# Patient Record
Sex: Female | Born: 1950 | Race: White | Hispanic: No
Health system: Midwestern US, Academic
[De-identification: ages and names within clinical notes are randomized; demographics above are authoritative.]

---

## 2016-09-06 MED ORDER — HEPARIN, PORCINE (PF) 100 UNIT/ML IV SYRG
500 [IU] | Freq: Once | 0 refills | Status: CP
Start: 2016-09-06 — End: ?

## 2016-09-06 MED ORDER — ESTRADIOL 2 MG (7.5 MCG /24 HOUR) VA RING
1 | VAGINAL | 3 refills | 43.00000 days | Status: DC
Start: 2016-09-06 — End: 2017-03-14

## 2017-03-14 ENCOUNTER — Encounter: Admit: 2017-03-14 | Discharge: 2017-03-14 | Payer: PRIVATE HEALTH INSURANCE

## 2017-03-14 ENCOUNTER — Encounter: Admit: 2017-03-14 | Discharge: 2017-03-14 | Payer: MEDICARE

## 2017-03-14 ENCOUNTER — Encounter: Admit: 2017-03-14 | Discharge: 2017-03-15 | Payer: MEDICARE

## 2017-03-14 DIAGNOSIS — Z170 Estrogen receptor positive status [ER+]: ICD-10-CM

## 2017-03-14 DIAGNOSIS — E669 Obesity, unspecified: ICD-10-CM

## 2017-03-14 DIAGNOSIS — Z86711 Personal history of pulmonary embolism: ICD-10-CM

## 2017-03-14 DIAGNOSIS — I10 Essential (primary) hypertension: ICD-10-CM

## 2017-03-14 DIAGNOSIS — C50919 Malignant neoplasm of unspecified site of unspecified female breast: ICD-10-CM

## 2017-03-14 DIAGNOSIS — Z9011 Acquired absence of right breast and nipple: ICD-10-CM

## 2017-03-14 DIAGNOSIS — C50811 Malignant neoplasm of overlapping sites of right female breast: Principal | ICD-10-CM

## 2017-03-14 DIAGNOSIS — Z79811 Long term (current) use of aromatase inhibitors: ICD-10-CM

## 2017-03-14 DIAGNOSIS — I82409 Acute embolism and thrombosis of unspecified deep veins of unspecified lower extremity: ICD-10-CM

## 2017-03-14 DIAGNOSIS — Z86718 Personal history of other venous thrombosis and embolism: ICD-10-CM

## 2017-03-14 DIAGNOSIS — I351 Nonrheumatic aortic (valve) insufficiency: ICD-10-CM

## 2017-03-14 DIAGNOSIS — C7951 Secondary malignant neoplasm of bone: ICD-10-CM

## 2017-03-14 DIAGNOSIS — T148XXA Other injury of unspecified body region, initial encounter: ICD-10-CM

## 2017-03-14 DIAGNOSIS — Z7901 Long term (current) use of anticoagulants: ICD-10-CM

## 2017-03-14 DIAGNOSIS — C50911 Malignant neoplasm of unspecified site of right female breast: ICD-10-CM

## 2017-03-14 MED ORDER — HEPARIN, PORCINE (PF) 100 UNIT/ML IV SYRG
500 [IU] | Freq: Once | 0 refills | Status: CP
Start: 2017-03-14 — End: ?

## 2017-03-14 NOTE — Progress Notes
Date of Service: 03/14/2017      Subjective:             Reason for Visit:    Sydney Mcconnell is a 66 y.o. female.    Cancer Staging  Malignant neoplasm of overlapping sites of right female breast [facility])  Staging form: Breast, AJCC 6th Edition  - Clinical: No stage assigned - Unsigned  - Pathologic: Stage IIB (T2, N1, MX) - Unsigned      History of Present Illness    CANCER HISTORY: The patient was originally diagnosed with right breast cancer in 1995, T2N1M0, 3/32 positive lymph nodes. Tumor was ER/PR positive, HER-2 Positive. She received chemotherapy with Adriamycin and Cytoxan for four cycles and then received five years of tamoxifen.    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  She had an intramedullary rod placed in the right femur and had radiation. She underwent 6 cycles of Taxotere and Herceptin, and was placed on Aredia and Arimidex. The Aredia was eventually switched to Zometa.     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 She underwent debridement, 20 hyperbaric oxygen therapies, and was dramatically improved. The process essentially arrested in 2008.     In 2010 she had the right femur rod replaced as it was bending. Intramedullary rod replaced again in Jan 2011 when she tripped and was found to have a linear nondisplaced fracture of the lateral cortex of the proximal R diaphysis corresponding to the focus of increased enhancement on the bone scan.    March 30, 2012 she had a fall and broke left femur. A rod was placed there. She had a long period of PT.     She had done well until May 2014 when she felt she pulled a muscle or broke a bone in her rib or scapula and had been in extreme pain not relieved by NSAIDS. Bone scan 09/26/12 showed an area of increased uptake in left posterior rib in area of discomfort. Compatible with small rib fracture (patient carrying a box when it occurred ). Plain rib films negative for fracture or mass. This pain was in the right back, under the scapula. Tumor markers did not rise.    Due to delayed union of a left subtrochanteric femur fracture, she met back with Dr Cherene Julian in Orthopedic surgery. She had dynamization (removal) of the left trochanteric femoral nail on 01/01/13.     She had follow-up with Dr Cherene Julian May 2015. X-rays show significant healing since 12/2012 x-rays.     Ongoing back pain after a snap sound in early July 2104:    CT chest done 11/02/12:  There are no pathologically enlarged mediastinal or hilar lymph nodes, though limited evaluation in the absence of IV contrast.   There is no new or enlarging pulmonary nodule.  There are no aggressive osseous lesions or displaced rib fracture. An old right posterior seventh rib fracture is seen. There is exaggeration of the normal thoracic kyphosis. Prior right mastectomy and flap reconstruction is again noted without axillary lymphadenopathy.      Mammogram 03/27/14: Unremarkable.     Mammogram 03/29/15:There are scattered areas of fibroglandular density. ???No masses, densities or calcifications to suggest malignancy. No change when compared to prior studies.    Mammogram Dec 2017:  There are scattered areas of fibroglandular density. ???No masses, densities or calcifications to suggest malignancy. No change  when  compared to prior studies.          Past Medical History: Episodic tachycardia; follows with cardiology. 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     Family History: Mother died with CHF age 52. 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         INTERVAL HISTORY:    She is here for her 6 month follow up.  She has a metastatic breast cancer history, but no recent bone progression. Continues on Arimidex, without much change.     Had an ER visit in July with an episode of right jaw numbness lasting 6 hours - CT head was ok.     Still having lymphedema of right arm, chronic, unchanged. Using compression sleeves and more helpful.     No pain meds needed. Back and leg much improved.     Vision is poor at baseline. She will get some vertigo.     Has a port in left arm. Functioning well when she gets blood draws.     History of DVT. No new leg pains. On chronic coumadin.  Taking 2 mg,  6 days a week.   INR not checked in a while.     Always sleeps sitting up - this is a chronic issue for years. Rare edema of legs.   Bps have been controlled.        Ref. Range 10/09/2015 14:31 03/26/2016 11:18 03/27/2016 06:00 04/03/2016 10:53 09/06/2016 10:53   CA27.29 Latest Units: U/mL 21  24  27.4     Port flushed today.        Review of Systems    No headaches or vision changes  No swallowing problems  No new lymph nodes enlarged  No bruising or petechiae  No breathing difficulty  The patient denies chest pain or pleurisy  No abdominal pain  No change in bowel habits or gastrointestinal bleeding  No hematuria  There has been no change in arthralgias  No new skin rashes  No leg edema noted      Objective:         ??? amLODIPine (NORVASC) 10 mg tablet Take 1 Tab by mouth daily.   ??? anastrozole (ARIMIDEX) 1 mg tablet Take 1 tablet by mouth daily.   ??? Calcium Carbonate 600 mg (1,500 mg) tab Take 2 Tabs by mouth daily.   ??? CARVEDILOL (COREG PO) Take  by mouth. Indications: 1 po BID   ??? cholecalciferol (VITAMIN D-3) 400 unit tab Take 400 Units by mouth twice daily.   ??? CYCLOBENZAPRINE HCL (FLEXERIL PO) Take 0.5 mg by mouth as Needed.   ??? estradiol (ESTRING) 2 mg (7.5 mcg /24 hour) vaginal ring Insert or Apply 1 each to vaginal area as directed. follow package directions   ??? ibuprofen (MOTRIN) 200 mg tablet Take 200 mg by mouth every 6 hours as needed for Pain. Indications: 4 po QD ??? lisinopril (PRINIVIL, ZESTRIL) 40 mg tablet Take 1 Tab by mouth twice daily.   ??? LORATADINE/PSEUDOEPHEDRINE (CLARITIN-D 12 HOUR PO) Take  by mouth.   ??? warfarin (COUMADIN) 2 mg tablet Take 1 tablet by mouth daily. 2 mg/day for six days 0 mg on seventh day  Indications: 6 day, 0 po on 7th day       There is no height  or weight on file to calculate BMI.               Pain Addressed:  N/A    Patient Evaluated for a Clinical Trial: Patient not eligible for a treatment trial (including not needing treatment, needs palliative care, in remission).     [place] Cooperative Oncology Group performance status is 1, Restricted in physically strenuous activity but ambulatory and able to carry out work of a light or sedentary nature, e.g., light house work, office work.     Physical Exam       In general, the patient is alert and pleasant  No change in vertigo with head position  No scleral icterus, no injection of the sclera  No sinus tenderness  No oral redness  No neck lymphadenopathy or supraclavicular lymphadenopathy  No oral thrush or buccal petechiae  Lungs are clear without wheezing or dullness  Heart is regular, with faint systolic murmur  Right mastectomy  Right arm with chronic lympedema  Abdomen is soft, non-tender, with normal bowel sounds, and no pain or mass  Extremities show no edema or tenderness  No rash or petechiae  Normal gait, normal cranial nerves         Assessment and Plan:    1. History of PE and DVT: Cont coumadin at 2 mg; 6 days a week.   Last INR tests reviewed    2. Metastatic breast cancer history: The patient was originally diagnosed with right breast cancer in 1995, T2N1M0, 3/32 positive lymph nodes. Tumor was ER/PR positive, HER-2 Positive. She received chemotherapy with Adriamycin and Cytoxan for four cycles and then received five years of tamoxifen.    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  She had an intramedullary rod placed in the right femur and had radiation. She underwent 6 cycles of Taxotere and Herceptin, and was placed on Aredia and Arimidex.     Remains on Anastrazole (Arimidex).  She has had no clinical recurrence, and has a normal tumor marker recently, with a redraw today.       3. Slight stable murmur; no signs of CHF    4. Elevated BP is controlled    5. Seasonal allergies; using claritin D prn. No signs of bacterial URI.     RTC in 6 months.

## 2017-03-14 NOTE — Progress Notes
Pt here for nd/port flush/follow up, PAC accessed, flushed with + blood return, labs drawn, PAC flushed and de accessed, to see provider.

## 2017-03-15 LAB — CA-27.29: Lab: 24 U/mL (ref <38)

## 2017-03-15 LAB — COMPREHENSIVE METABOLIC PANEL: Lab: 104 mg/dL — ABNORMAL HIGH (ref >=60)

## 2017-03-24 ENCOUNTER — Encounter: Admit: 2017-03-24 | Discharge: 2017-03-24 | Payer: MEDICARE

## 2017-03-24 DIAGNOSIS — I359 Nonrheumatic aortic valve disorder, unspecified: Principal | ICD-10-CM

## 2017-03-24 MED ORDER — WARFARIN 2 MG PO TAB
2 mg | ORAL_TABLET | Freq: Every day | ORAL | 3 refills | 90.00000 days | Status: AC
Start: 2017-03-24 — End: 2018-03-30

## 2017-06-17 ENCOUNTER — Encounter: Admit: 2017-06-17 | Discharge: 2017-06-17 | Payer: MEDICARE

## 2017-06-17 MED ORDER — ANASTROZOLE 1 MG PO TAB
1 mg | ORAL_TABLET | Freq: Every day | ORAL | 3 refills | 33.00000 days | Status: AC
Start: 2017-06-17 — End: 2018-03-30

## 2017-07-05 ENCOUNTER — Encounter: Admit: 2017-07-05 | Discharge: 2017-07-05 | Payer: PRIVATE HEALTH INSURANCE

## 2017-07-05 DIAGNOSIS — C50919 Malignant neoplasm of unspecified site of unspecified female breast: Principal | ICD-10-CM

## 2017-07-05 DIAGNOSIS — C7951 Secondary malignant neoplasm of bone: ICD-10-CM

## 2017-07-05 DIAGNOSIS — Z170 Estrogen receptor positive status [ER+]: ICD-10-CM

## 2017-07-05 MED ORDER — HEPARIN, PORCINE (PF) 100 UNIT/ML IV SYRG
500 [IU] | Freq: Once | 0 refills | Status: CP
Start: 2017-07-05 — End: ?

## 2017-08-06 ENCOUNTER — Encounter: Admit: 2017-08-06 | Discharge: 2017-08-07 | Payer: MEDICARE

## 2017-08-07 ENCOUNTER — Encounter: Admit: 2017-08-07 | Discharge: 2017-08-07 | Payer: MEDICARE

## 2017-09-02 ENCOUNTER — Encounter: Admit: 2017-09-02 | Discharge: 2017-09-02 | Payer: MEDICARE

## 2017-09-02 DIAGNOSIS — Z1231 Encounter for screening mammogram for malignant neoplasm of breast: Principal | ICD-10-CM

## 2017-09-03 ENCOUNTER — Encounter: Admit: 2017-09-03 | Discharge: 2017-09-03 | Payer: MEDICARE

## 2017-09-03 ENCOUNTER — Encounter: Admit: 2017-09-03 | Discharge: 2017-09-03 | Payer: PRIVATE HEALTH INSURANCE

## 2017-09-03 DIAGNOSIS — Z1231 Encounter for screening mammogram for malignant neoplasm of breast: Principal | ICD-10-CM

## 2017-09-26 ENCOUNTER — Encounter: Admit: 2017-09-26 | Discharge: 2017-09-26 | Payer: MEDICARE

## 2017-09-26 ENCOUNTER — Encounter: Admit: 2017-09-26 | Discharge: 2017-09-26 | Payer: PRIVATE HEALTH INSURANCE

## 2017-09-26 DIAGNOSIS — Z7901 Long term (current) use of anticoagulants: ICD-10-CM

## 2017-09-26 DIAGNOSIS — E669 Obesity, unspecified: ICD-10-CM

## 2017-09-26 DIAGNOSIS — I82409 Acute embolism and thrombosis of unspecified deep veins of unspecified lower extremity: ICD-10-CM

## 2017-09-26 DIAGNOSIS — I10 Essential (primary) hypertension: ICD-10-CM

## 2017-09-26 DIAGNOSIS — Z170 Estrogen receptor positive status [ER+]: ICD-10-CM

## 2017-09-26 DIAGNOSIS — Z1231 Encounter for screening mammogram for malignant neoplasm of breast: ICD-10-CM

## 2017-09-26 DIAGNOSIS — I351 Nonrheumatic aortic (valve) insufficiency: Secondary | ICD-10-CM

## 2017-09-26 DIAGNOSIS — D6859 Other primary thrombophilia: ICD-10-CM

## 2017-09-26 DIAGNOSIS — I9589 Other hypotension: ICD-10-CM

## 2017-09-26 DIAGNOSIS — C7951 Secondary malignant neoplasm of bone: ICD-10-CM

## 2017-09-26 DIAGNOSIS — Z79811 Long term (current) use of aromatase inhibitors: ICD-10-CM

## 2017-09-26 DIAGNOSIS — Z86718 Personal history of other venous thrombosis and embolism: ICD-10-CM

## 2017-09-26 DIAGNOSIS — C50811 Malignant neoplasm of overlapping sites of right female breast: Principal | ICD-10-CM

## 2017-09-26 DIAGNOSIS — C50919 Malignant neoplasm of unspecified site of unspecified female breast: Principal | ICD-10-CM

## 2017-09-26 DIAGNOSIS — T148XXA Other injury of unspecified body region, initial encounter: ICD-10-CM

## 2017-09-26 LAB — CBC AND DIFF
Lab: 0.1 10*3/uL (ref >60)
Lab: 0.2 10*3/uL (ref >60)
Lab: 0.5 10*3/uL (ref 0–0.80)
Lab: 1 % (ref 0–2)
Lab: 1 10*3/uL (ref 1.0–4.8)
Lab: 10 % (ref 4–12)
Lab: 12 g/dL (ref 12.0–15.0)
Lab: 13 % (ref >60)
Lab: 18 % — ABNORMAL LOW (ref 24–44)
Lab: 231 K/UL (ref >60)
Lab: 3 % (ref 0–5)
Lab: 3.8 10*3/uL (ref 1.8–7.0)
Lab: 31 pg (ref 26–34)
Lab: 5.6 K/UL (ref 4.5–11.0)
Lab: 68 % (ref 41–77)
Lab: 8.4 FL (ref >60)

## 2017-09-26 LAB — COMPREHENSIVE METABOLIC PANEL
Lab: 10 U/L (ref 7–56)
Lab: 137 MMOL/L (ref 137–147)
Lab: 4.2 MMOL/L (ref 3.5–5.1)
Lab: >60 mL/min — ABNORMAL HIGH (ref >60)

## 2017-09-26 LAB — PROTIME INR (PT): Lab: 2.9 — ABNORMAL HIGH (ref 0.8–1.2)

## 2017-09-26 MED ORDER — HEPARIN, PORCINE (PF) 100 UNIT/ML IV SYRG
500 [IU] | Freq: Once | 0 refills | Status: CP
Start: 2017-09-26 — End: ?

## 2017-09-27 LAB — CA-27.29: Lab: 24 U/mL

## 2018-03-30 ENCOUNTER — Encounter: Admit: 2018-03-30 | Discharge: 2018-03-30 | Payer: MEDICARE

## 2018-03-30 ENCOUNTER — Encounter: Admit: 2018-03-30 | Discharge: 2018-03-30 | Payer: PRIVATE HEALTH INSURANCE

## 2018-03-30 DIAGNOSIS — C773 Secondary and unspecified malignant neoplasm of axilla and upper limb lymph nodes: ICD-10-CM

## 2018-03-30 DIAGNOSIS — Z9011 Acquired absence of right breast and nipple: ICD-10-CM

## 2018-03-30 DIAGNOSIS — C50919 Malignant neoplasm of unspecified site of unspecified female breast: Principal | ICD-10-CM

## 2018-03-30 DIAGNOSIS — C7951 Secondary malignant neoplasm of bone: ICD-10-CM

## 2018-03-30 DIAGNOSIS — Z7901 Long term (current) use of anticoagulants: ICD-10-CM

## 2018-03-30 DIAGNOSIS — D6859 Other primary thrombophilia: Secondary | ICD-10-CM

## 2018-03-30 DIAGNOSIS — Z9882 Breast implant status: ICD-10-CM

## 2018-03-30 DIAGNOSIS — Z5181 Encounter for therapeutic drug level monitoring: Secondary | ICD-10-CM

## 2018-03-30 DIAGNOSIS — I359 Nonrheumatic aortic valve disorder, unspecified: Secondary | ICD-10-CM

## 2018-03-30 DIAGNOSIS — Z79811 Long term (current) use of aromatase inhibitors: ICD-10-CM

## 2018-03-30 DIAGNOSIS — E669 Obesity, unspecified: ICD-10-CM

## 2018-03-30 DIAGNOSIS — C50811 Malignant neoplasm of overlapping sites of right female breast: Principal | ICD-10-CM

## 2018-03-30 DIAGNOSIS — Z86718 Personal history of other venous thrombosis and embolism: ICD-10-CM

## 2018-03-30 DIAGNOSIS — Z170 Estrogen receptor positive status [ER+]: ICD-10-CM

## 2018-03-30 DIAGNOSIS — I10 Essential (primary) hypertension: ICD-10-CM

## 2018-03-30 DIAGNOSIS — I82409 Acute embolism and thrombosis of unspecified deep veins of unspecified lower extremity: ICD-10-CM

## 2018-03-30 DIAGNOSIS — T148XXA Other injury of unspecified body region, initial encounter: ICD-10-CM

## 2018-03-30 DIAGNOSIS — I351 Nonrheumatic aortic (valve) insufficiency: ICD-10-CM

## 2018-03-30 LAB — CBC AND DIFF
Lab: 0 10*3/uL (ref 0–0.20)
Lab: 0.1 10*3/uL (ref 0–0.45)
Lab: 0.5 10*3/uL (ref 0–0.80)
Lab: 1 % (ref >60)
Lab: 1.1 10*3/uL (ref 1.0–4.8)
Lab: 12 g/dL (ref 12.0–15.0)
Lab: 13 % (ref 11–15)
Lab: 2 % (ref >60)
Lab: 205 K/UL (ref 150–400)
Lab: 31 pg (ref 26–34)
Lab: 33 g/dL (ref 32.0–36.0)
Lab: 37 % (ref 36–45)
Lab: 4 M/UL (ref 4.0–5.0)
Lab: 4.1 10*3/uL (ref 1.8–7.0)
Lab: 5.9 K/UL (ref 4.5–11.0)
Lab: 69 % (ref 41–77)
Lab: 7.6 FL (ref 7–11)
Lab: 93 FL (ref 80–100)

## 2018-03-30 LAB — COMPREHENSIVE METABOLIC PANEL
Lab: 143 MMOL/L (ref 137–147)
Lab: 3.6 MMOL/L (ref 3.5–5.1)

## 2018-03-30 LAB — PROTIME INR (PT): Lab: 4.4 — ABNORMAL HIGH (ref 0.8–1.2)

## 2018-03-30 MED ORDER — HEPARIN, PORCINE (PF) 100 UNIT/ML IV SYRG
500 [IU] | Freq: Once | 0 refills | Status: CP
Start: 2018-03-30 — End: ?

## 2018-03-30 MED ORDER — ANASTROZOLE 1 MG PO TAB
1 mg | ORAL_TABLET | Freq: Every day | ORAL | 3 refills | 33.00000 days | Status: AC
Start: 2018-03-30 — End: 2019-05-16

## 2018-03-30 MED ORDER — WARFARIN 2 MG PO TAB
2 mg | ORAL_TABLET | Freq: Every day | ORAL | 3 refills | 90.00000 days | Status: AC
Start: 2018-03-30 — End: 2019-04-02

## 2018-03-31 ENCOUNTER — Encounter: Admit: 2018-03-31 | Discharge: 2018-03-31 | Payer: MEDICARE

## 2018-03-31 DIAGNOSIS — D6859 Other primary thrombophilia: Principal | ICD-10-CM

## 2018-03-31 LAB — CA-27.29: Lab: 19 U/mL

## 2018-04-12 ENCOUNTER — Encounter: Admit: 2018-04-12 | Discharge: 2018-04-12 | Payer: MEDICARE

## 2018-04-12 ENCOUNTER — Encounter: Admit: 2018-04-12 | Discharge: 2018-04-12 | Payer: PRIVATE HEALTH INSURANCE

## 2018-04-12 DIAGNOSIS — D6859 Other primary thrombophilia: Principal | ICD-10-CM

## 2018-04-12 LAB — PROTIME INR (PT): Lab: 3.7 — ABNORMAL HIGH (ref 0.8–1.2)

## 2018-07-07 ENCOUNTER — Encounter: Admit: 2018-07-07 | Discharge: 2018-07-07 | Payer: MEDICARE

## 2018-07-07 ENCOUNTER — Encounter: Admit: 2018-07-07 | Discharge: 2018-07-07 | Payer: PRIVATE HEALTH INSURANCE

## 2018-07-07 DIAGNOSIS — Z452 Encounter for adjustment and management of vascular access device: Principal | ICD-10-CM

## 2018-07-07 DIAGNOSIS — Z170 Estrogen receptor positive status [ER+]: ICD-10-CM

## 2018-07-07 DIAGNOSIS — C50811 Malignant neoplasm of overlapping sites of right female breast: ICD-10-CM

## 2018-07-07 MED ORDER — HEPARIN, PORCINE (PF) 100 UNIT/ML IV SYRG
500 [IU] | Freq: Once | 0 refills | Status: CP
Start: 2018-07-07 — End: ?

## 2018-08-30 ENCOUNTER — Encounter: Admit: 2018-08-30 | Discharge: 2018-08-30 | Payer: MEDICARE

## 2018-09-08 ENCOUNTER — Encounter: Admit: 2018-09-08 | Discharge: 2018-09-08 | Payer: PRIVATE HEALTH INSURANCE

## 2018-09-08 DIAGNOSIS — Z95828 Presence of other vascular implants and grafts: ICD-10-CM

## 2018-09-08 DIAGNOSIS — C50811 Malignant neoplasm of overlapping sites of right female breast: ICD-10-CM

## 2018-09-08 DIAGNOSIS — E785 Hyperlipidemia, unspecified: Secondary | ICD-10-CM

## 2018-09-08 DIAGNOSIS — Z7901 Long term (current) use of anticoagulants: ICD-10-CM

## 2018-09-08 DIAGNOSIS — C50919 Malignant neoplasm of unspecified site of unspecified female breast: Principal | ICD-10-CM

## 2018-09-08 NOTE — Progress Notes
She had done well until May 2014 when she felt she pulled a muscle or broke a bone in her rib or scapula and had been in extreme pain not relieved by NSAIDS. Bone scan 09/26/12 showed an area of increased uptake in left posterior rib in area of discomfort. Compatible with small rib fracture (patient carrying a box when it occurred ). Plain rib films negative for fracture or mass. This pain was in the right back, under the scapula. Tumor markers did not rise.    Due to delayed union of a left subtrochanteric femur fracture, she met back with Dr Cherene Julian in Orthopedic surgery. She had dynamization (removal) of the left trochanteric femoral nail on 01/01/13.     She had follow-up with Dr Cherene Julian May 2015. X-rays show significant healing since 12/2012 x-rays.     Ongoing back pain after a snap sound in early July 2104:    CT chest done 11/02/12:  There are no pathologically enlarged mediastinal or hilar lymph nodes, though limited evaluation in the absence of IV contrast.   There is no new or enlarging pulmonary nodule.  There are no aggressive osseous lesions or displaced rib fracture. An old right posterior seventh rib fracture is seen. There is exaggeration of the normal thoracic kyphosis. Prior right mastectomy and flap reconstruction is again noted without axillary lymphadenopathy.    Mammograms 2015 to 2018 on record     Had an ER visit in July 2018 with an episode of right jaw numbness lasting 6 hours - CT head was ok.     Mammogram 09/03/17:  There are scattered areas of fibroglandular density. ???2D, and 3D   images were obtained. No masses, densities or calcifications to   suggest malignancy. No change when compared to prior studies.        Past Medical History: Episodic tachycardia; follows with cardiology. 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     Family History: Mother died with CHF age 60. Sister with negative BRCA Tumor marker pending next week.     3. Slight stable murmur; no signs of CHF  Checking lipid panel next week    4. Elevated BP is controlled    5. Seasonal allergies; using claritin D prn.     6. Arm port in place: Flush at least every 3 months, I have asked for more often.       RTC in 6 months.

## 2018-09-10 IMAGING — CR UP_EXM
2 series · 2 of 2 positions shown · non-contrast
Comparison: none

[wrist pa]
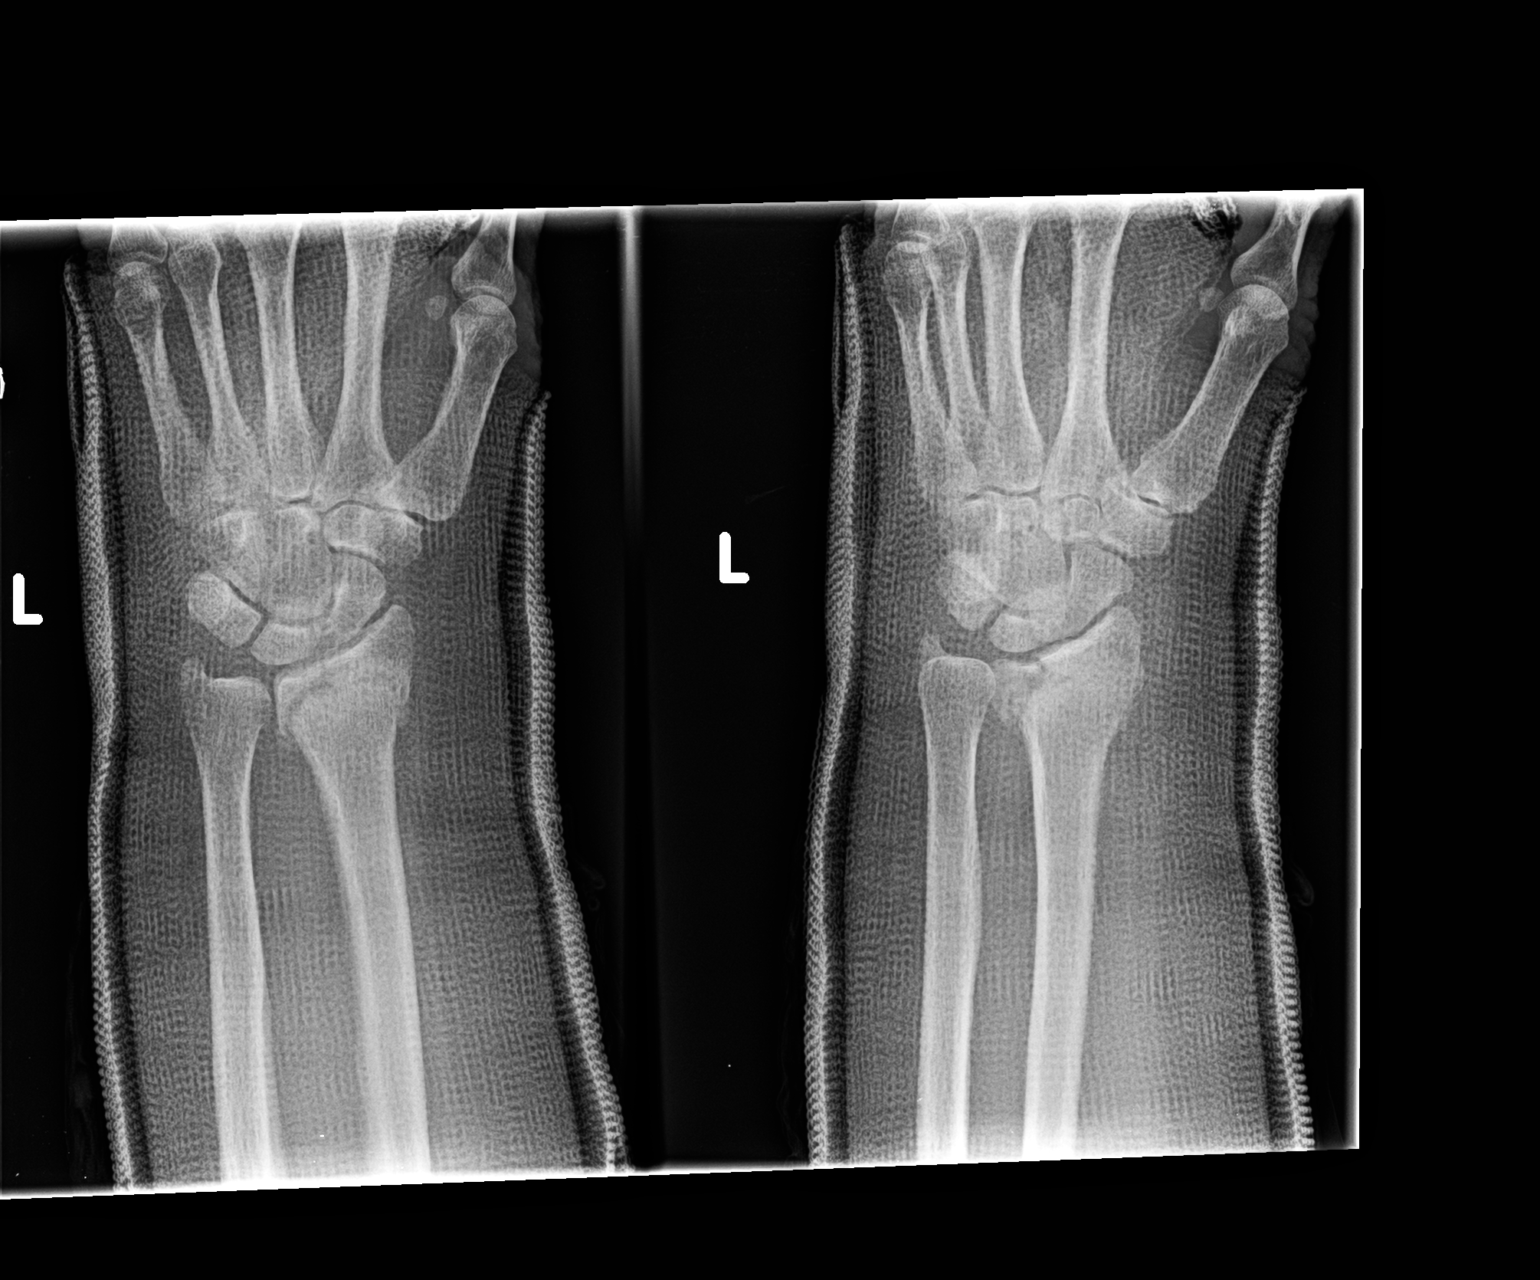

[wrist lat]
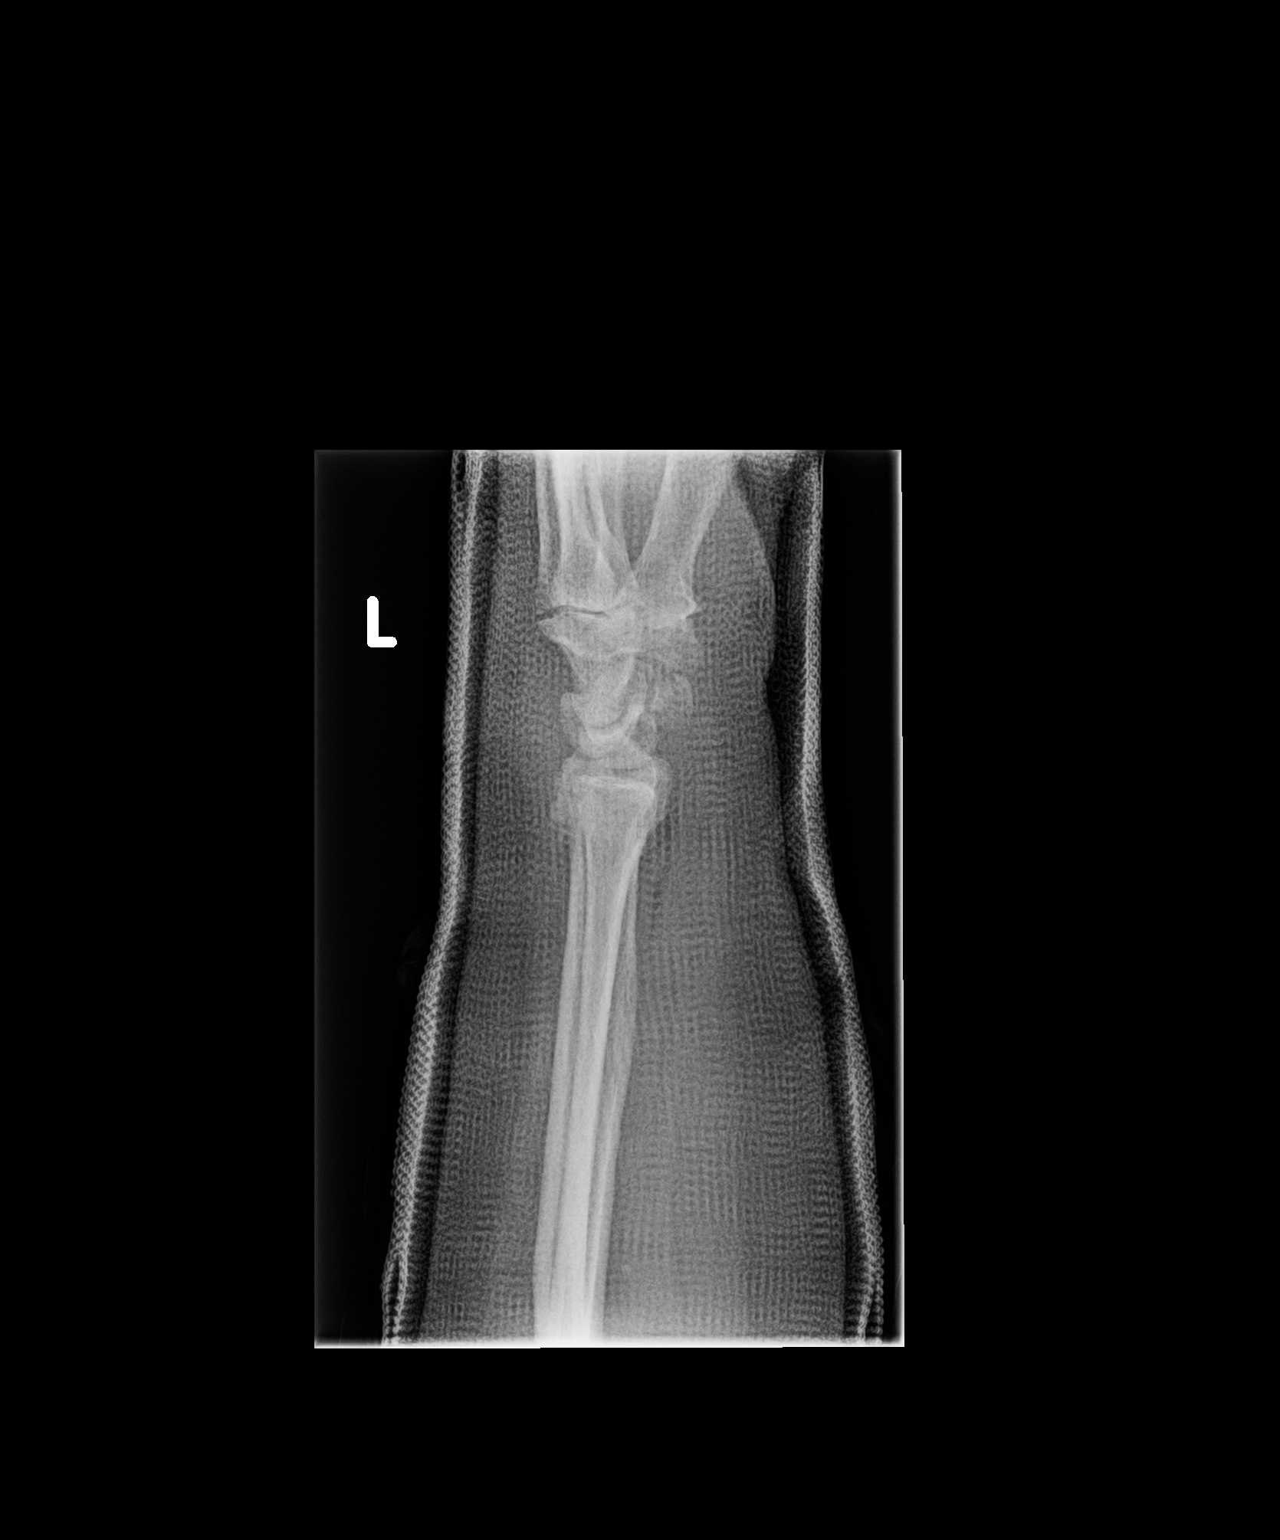

[2 of 2 positions shown; findings below may reference images not displayed]

09/12/17

EXAM
Left wrist.

INDICATION
follow-up left wrist fracture
F/U FX.

FINDINGS
Three views of the left wrist were obtained. Prior studies were reviewed from 08/24/2017 and
08/18/2017.
Comminuted intra-articular fracture of the distal left radius is identified. There is
millimeter offset of the fragments at the articular surface which appears slightly increased. There
is incomplete bony union. There has been a slight increase and hypertrophic callus formation.

IMPRESSION
There is a 1.5 millimeter offset of the fragment margins of the articular surface of the distal
left radius. This appears slightly increased although this could be due to projectional differences.

There is slightly increased hypertrophic callus formation.

Tech Notes:

F/U FX.

## 2018-09-13 ENCOUNTER — Encounter: Admit: 2018-09-13 | Discharge: 2018-09-13 | Payer: MEDICARE

## 2018-09-13 ENCOUNTER — Encounter: Admit: 2018-09-13 | Discharge: 2018-09-13 | Payer: PRIVATE HEALTH INSURANCE

## 2018-09-13 ENCOUNTER — Encounter: Admit: 2018-09-13 | Discharge: 2018-09-14 | Payer: MEDICARE

## 2018-09-13 DIAGNOSIS — Z7901 Long term (current) use of anticoagulants: ICD-10-CM

## 2018-09-13 DIAGNOSIS — C50919 Malignant neoplasm of unspecified site of unspecified female breast: Principal | ICD-10-CM

## 2018-09-13 LAB — CBC AND DIFF
Lab: 30 pg (ref 26–34)
Lab: 38 % (ref 36–45)
Lab: 4.1 M/UL (ref 4.0–5.0)
Lab: 5.7 10*3/uL (ref 4.5–11.0)

## 2018-09-13 LAB — COMPREHENSIVE METABOLIC PANEL
Lab: 0.3 mg/dL (ref 0.3–1.2)
Lab: 0.7 mg/dL — ABNORMAL LOW (ref 0.4–1.00)
Lab: 105 MMOL/L (ref 98–110)
Lab: 108 mg/dL — ABNORMAL HIGH (ref 70–100)
Lab: 11 (ref 3–12)
Lab: 13 U/L (ref 7–56)
Lab: 140 MMOL/L (ref 137–147)
Lab: 20 U/L (ref 7–40)
Lab: 24 MMOL/L (ref 21–30)
Lab: 24 mg/dL (ref 7–25)
Lab: 3.9 g/dL (ref 3.5–5.0)
Lab: 4.1 MMOL/L (ref 3.5–5.1)
Lab: 7 g/dL (ref 6.0–8.0)
Lab: 76 U/L (ref 25–110)
Lab: 9 mg/dL (ref 8.5–10.6)
Lab: >60 mL/min (ref >60)
Lab: >60 mL/min (ref >60)

## 2018-09-13 LAB — PROTIME INR (PT): Lab: 5.2 g/dL — ABNORMAL HIGH (ref 0.8–1.2)

## 2018-09-13 MED ORDER — HEPARIN, PORCINE (PF) 100 UNIT/ML IV SYRG
500 [IU] | Freq: Once | 0 refills | Status: CP
Start: 2018-09-13 — End: ?

## 2018-09-13 NOTE — Telephone Encounter
Left VM for pt with instructions per Dr. Gerarda Fraction:  Hold coumadin 2 days. Restart at same dose (2mg ) taking 5 days a week instead of 6 days a week.  Asked pt to call back to confirm message received.

## 2018-09-15 LAB — CA-27.29: Lab: 20 U/mL (ref 80–100)

## 2018-09-20 ENCOUNTER — Encounter: Admit: 2018-09-20 | Discharge: 2018-09-20 | Payer: MEDICARE

## 2018-09-20 DIAGNOSIS — C50811 Malignant neoplasm of overlapping sites of right female breast: Principal | ICD-10-CM

## 2018-10-12 IMAGING — CR UP_EXM
3 series · 3 of 3 positions shown · non-contrast
Comparison: none

[wrist pa]
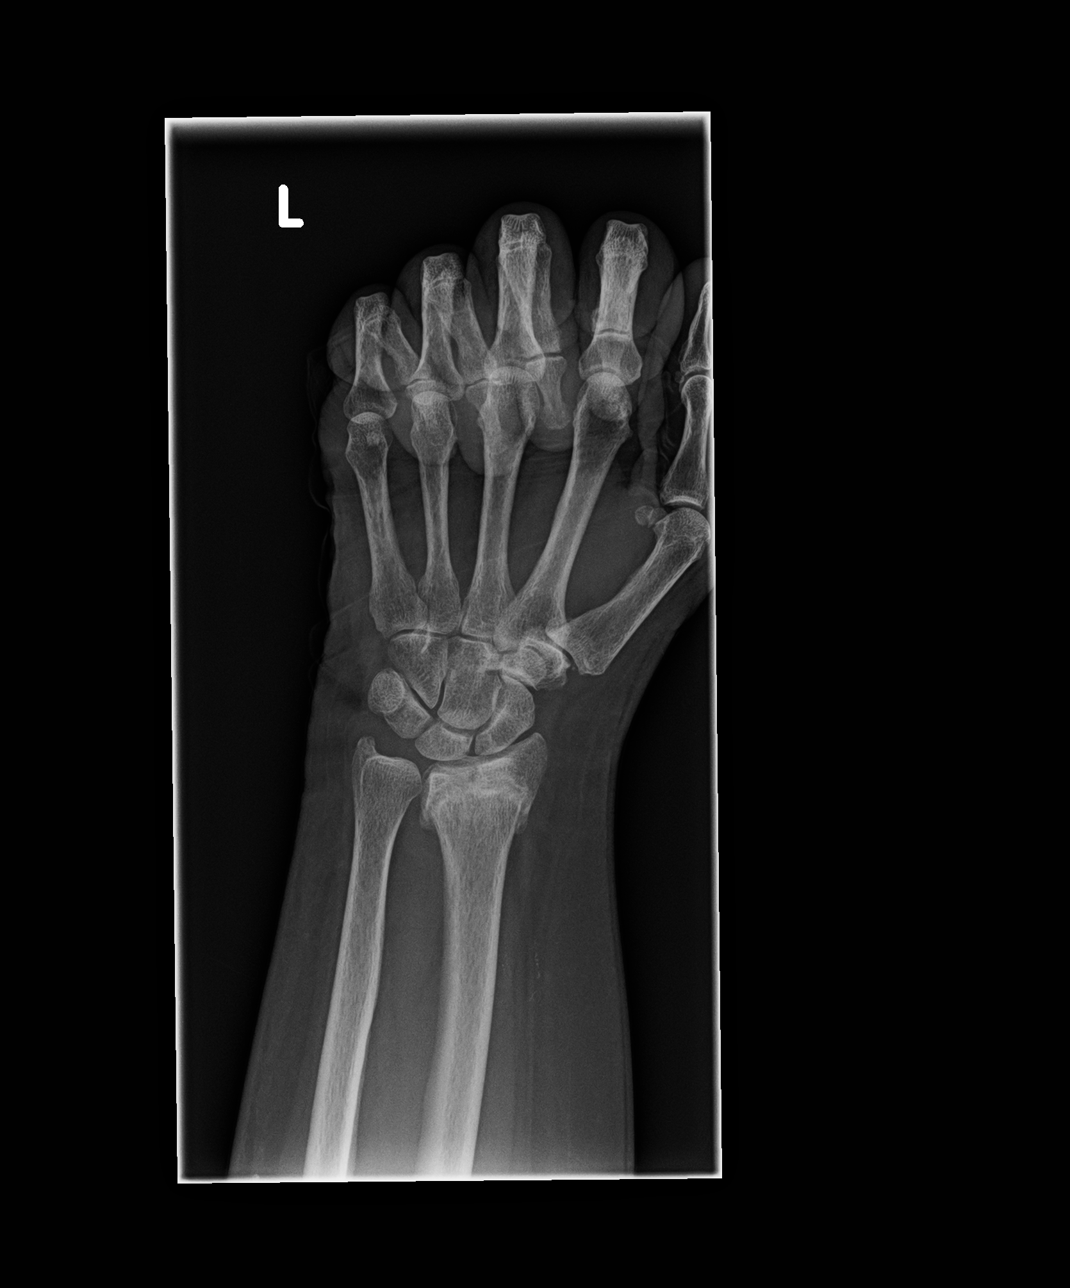

[wrist obl]
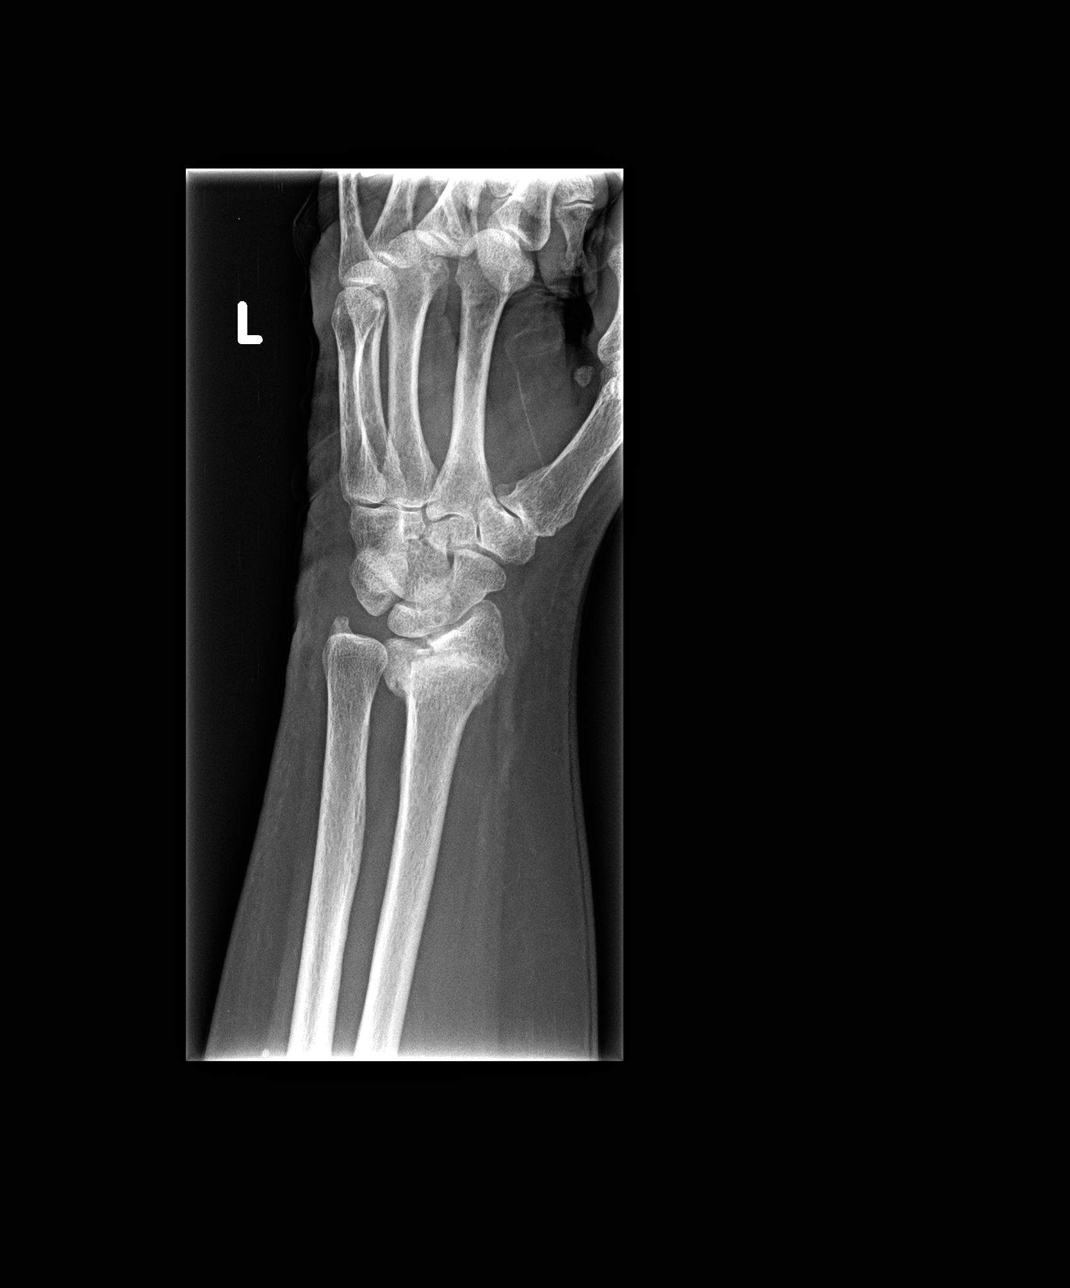

[wrist lat]
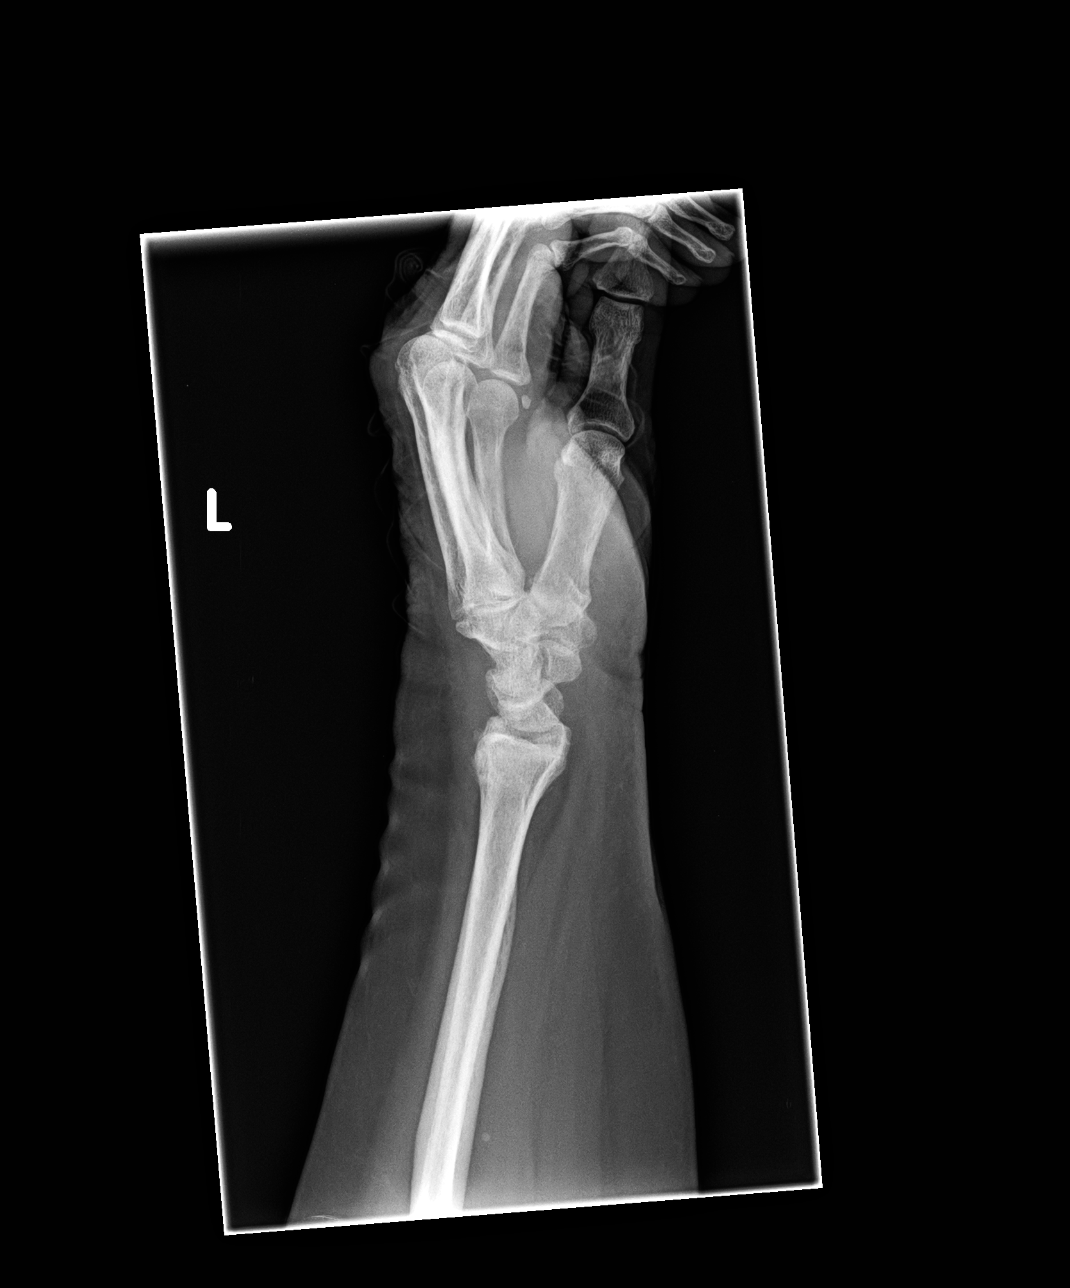

[3 of 3 positions shown; findings below may reference images not displayed]

10/14/17

DIAGNOSTIC STUDIES

EXAM

Left wrist radiographs.

INDICATION

left wrist fracture follow-up
RECHECK FX. ME

TECHNIQUE

Follow-up left wrist fracture.

COMPARISONS

PA, oblique, and lateral views of the left wrist.

FINDINGS

There is a healing distal radius fracture with intra-articular extension. The alignment is not
significantly changed. Bony remodeling with increased sclerosis noted. Distal radioulnar joint
degeneration is present. There is also 1st carpometacarpal degenerative change.

IMPRESSION

Unchanged alignment of healing distal radius fracture.

Tech Notes:

RECHECK FX. ME

## 2018-12-27 ENCOUNTER — Encounter: Admit: 2018-12-27 | Discharge: 2018-12-27

## 2018-12-27 DIAGNOSIS — Z452 Encounter for adjustment and management of vascular access device: Secondary | ICD-10-CM

## 2018-12-27 DIAGNOSIS — C50811 Malignant neoplasm of overlapping sites of right female breast: Secondary | ICD-10-CM

## 2018-12-27 MED ORDER — HEPARIN, PORCINE (PF) 100 UNIT/ML IV SYRG
500 [IU] | Freq: Once | 0 refills | Status: CP
Start: 2018-12-27 — End: ?

## 2018-12-27 NOTE — Progress Notes
Pt here for a port flush. Pt voices no complaints. Port accessed with +BR noted. Pt tolerated well. Port flushed and de accessed. Pt discharged to home in stable condition.

## 2019-01-06 ENCOUNTER — Encounter: Admit: 2019-01-06 | Discharge: 2019-01-06

## 2019-03-15 ENCOUNTER — Encounter: Admit: 2019-03-15 | Discharge: 2019-03-15 | Payer: PRIVATE HEALTH INSURANCE

## 2019-03-15 ENCOUNTER — Encounter: Admit: 2019-03-15 | Discharge: 2019-03-15 | Payer: MEDICARE

## 2019-03-15 DIAGNOSIS — Z5181 Encounter for therapeutic drug level monitoring: Secondary | ICD-10-CM

## 2019-03-15 DIAGNOSIS — Z7901 Long term (current) use of anticoagulants: Secondary | ICD-10-CM

## 2019-03-15 DIAGNOSIS — T148XXA Other injury of unspecified body region, initial encounter: Secondary | ICD-10-CM

## 2019-03-15 DIAGNOSIS — C50811 Malignant neoplasm of overlapping sites of right female breast: Secondary | ICD-10-CM

## 2019-03-15 DIAGNOSIS — I351 Nonrheumatic aortic (valve) insufficiency: Secondary | ICD-10-CM

## 2019-03-15 DIAGNOSIS — C50919 Malignant neoplasm of unspecified site of unspecified female breast: Secondary | ICD-10-CM

## 2019-03-15 DIAGNOSIS — D6859 Other primary thrombophilia: Secondary | ICD-10-CM

## 2019-03-15 DIAGNOSIS — I10 Essential (primary) hypertension: Secondary | ICD-10-CM

## 2019-03-15 DIAGNOSIS — I82409 Acute embolism and thrombosis of unspecified deep veins of unspecified lower extremity: Secondary | ICD-10-CM

## 2019-03-15 DIAGNOSIS — E669 Obesity, unspecified: Secondary | ICD-10-CM

## 2019-03-15 DIAGNOSIS — Z95828 Presence of other vascular implants and grafts: Secondary | ICD-10-CM

## 2019-03-15 LAB — COMPREHENSIVE METABOLIC PANEL
Lab: 0.4 mg/dL (ref 0.3–1.2)
Lab: 0.8 mg/dL (ref 0.4–1.00)
Lab: 105 MMOL/L (ref 98–110)
Lab: 139 MMOL/L (ref 137–147)
Lab: 16 U/L (ref 7–56)
Lab: 19 U/L (ref 7–40)
Lab: 23 MMOL/L (ref 21–30)
Lab: 3.9 g/dL (ref 3.5–5.0)
Lab: 33 mg/dL — ABNORMAL HIGH (ref 7–25)
Lab: 4.5 MMOL/L (ref 3.5–5.1)
Lab: 64 U/L (ref 25–110)
Lab: 7.3 g/dL (ref 6.0–8.0)
Lab: 84 mg/dL (ref 70–100)
Lab: 9.3 mg/dL (ref 8.5–10.6)
Lab: >60 mL/min (ref >60)
Lab: >60 mL/min (ref >60)
Lab: 11 (ref 3–12)

## 2019-03-15 LAB — CBC AND DIFF
Lab: 1 % (ref 0–2)
Lab: 12 % (ref 4–12)
Lab: 12 g/dL (ref 12.0–15.0)
Lab: 13 % (ref 11–15)
Lab: 19 % — ABNORMAL LOW (ref 24–44)
Lab: 2 % (ref 0–5)
Lab: 208 10*3/uL (ref 150–400)
Lab: 3.8 10*3/uL (ref 1.8–7.0)
Lab: 31 pg (ref 26–34)
Lab: 33 g/dL (ref 32.0–36.0)
Lab: 38 % (ref 36–45)
Lab: 4 M/UL (ref 4.0–5.0)
Lab: 5.8 10*3/uL (ref 4.5–11.0)
Lab: 66 % (ref 41–77)
Lab: 8.6 FL (ref 7–11)
Lab: 94 FL (ref 80–100)

## 2019-03-15 LAB — PROTIME INR (PT): Lab: 5.1 — ABNORMAL HIGH (ref 0.8–1.2)

## 2019-03-15 MED ORDER — HEPARIN, PORCINE (PF) 100 UNIT/ML IV SYRG
500 [IU] | Freq: Once | 0 refills | Status: CP
Start: 2019-03-15 — End: ?

## 2019-03-15 NOTE — Progress Notes
Date of Service: 03/15/2019      Subjective:             Reason for Visit:    Sydney Mcconnell is a 68 y.o. female.    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 Tumor was ER/PR positive, HER-2 Positive. She received chemotherapy with Adriamycin and Cytoxan for four cycles and then received five years of tamoxifen.    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  She had an intramedullary rod placed in the right femur and had radiation. She underwent 6 cycles of Taxotere and Herceptin, and was placed on Aredia and Arimidex. The Aredia was eventually switched to Zometa.     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 She underwent debridement, 20 hyperbaric oxygen therapies, and was dramatically improved. The process essentially arrested in 2008.     In 2010 she had the right femur rod replaced as it was bending. Intramedullary rod replaced again in Jan 2011 when she tripped and was found to have a linear nondisplaced fracture of the lateral cortex of the proximal R diaphysis corresponding to the focus of increased enhancement on the bone scan.    March 30, 2012 she had a fall and broke left femur. A rod was placed there. She had a long period of PT. She had done well until May 2014 when she felt she pulled a muscle or broke a bone in her rib or scapula and had been in extreme pain not relieved by NSAIDS. Bone scan 09/26/12 showed an area of increased uptake in left posterior rib in area of discomfort. Compatible with small rib fracture (patient carrying a box when it occurred ). Plain rib films negative for fracture or mass. This pain was in the right back, under the scapula. Tumor markers did not rise.    Due to delayed union of a left subtrochanteric femur fracture, she met back with Dr Cherene Julian in Orthopedic surgery. She had dynamization (removal) of the left trochanteric femoral nail on 01/01/13.     She had follow-up with Dr Cherene Julian May 2015. X-rays show significant healing since 12/2012 x-rays.     Ongoing back pain after a snap sound in early July 2104:    CT chest done 11/02/12:  There are no pathologically enlarged mediastinal or hilar lymph nodes, though limited evaluation in the absence of IV contrast.   There is no new or enlarging pulmonary nodule.  There are no aggressive osseous lesions or displaced rib fracture. An old right posterior seventh rib fracture is seen. There is exaggeration of the normal thoracic kyphosis. Prior right mastectomy and flap reconstruction is again noted without axillary lymphadenopathy.    Mammograms 2015 to 2018 on record     Had an ER visit in July 2018 with an episode of right jaw numbness lasting 6 hours - CT head was ok.     Mammogram 09/03/17:  There are scattered areas of fibroglandular density. ?2D, and 3D   images were obtained. No masses, densities or  calcifications to   suggest malignancy. No change when compared to prior studies.          Past Medical History: Episodic tachycardia; follows with cardiology. 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 Family History: Mother died with CHF age 81. 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           INTERVAL HISTORY:    She has a metastatic breast cancer history, but no recent bone progression.     Here for a 6 month follow up.      Continues on Arimidex.    Tumor marker is pending.        Ref. Range 09/13/2018 14:39   CA27.29 Reference range: <=38.0     Latest Units: U/mL 20.6       Still doing an hour a day on the bike, 6 days a week.     Due for mammogram at [facility].     Still having lymphedema of right arm, chronic, unchanged. Using compression sleeves and more helpful. Has a port in left arm. Functioning well when she gets blood draws.     No pain meds needed. Back and leg much improved.     Vision is poor at baseline. She will get some vertigo.     History of DVT. No new leg pains. On chronic coumadin.  Taking 2 mg,  6 days a week.   INR today 5.1    Always sleeps sitting up - this is a chronic issue for years. Rare edema of legs.          Review of Systems    No headaches or vision changes  No swallowing problems  No new lymph nodes enlarged  No bruising or petechiae  No breathing difficulty  The patient denies chest pain or pleurisy  No abdominal pain  No change in bowel habits or gastrointestinal bleeding  No hematuria  There has been no change in arthralgias  No new skin rashes  No leg edema noted      Objective:         ? amLODIPine (NORVASC) 10 mg tablet Take 1 Tab by mouth daily. (Patient taking differently: Take 10 mg by mouth daily. 1/2 po bid)   ? anastrozole (ARIMIDEX) 1 mg tablet Take one tablet by mouth daily.   ? Calcium Carbonate 600 mg (1,500 mg) tab Take 2 Tabs by mouth daily.   ? CARVEDILOL (COREG PO) Take  by mouth. Indications: 1/2 po bid   ? cholecalciferol (VITAMIN D-3) 400 unit tab Take 400 Units by mouth twice daily.   ? CYCLOBENZAPRINE HCL (FLEXERIL PO) Take 0.5 mg by mouth as Needed. ? dextromethorphan/pseudoephed (PSEUDOEPHEDRINE-DM PO) Take  by mouth.   ? IBUPROFEN IB PO Take  by mouth.   ? lisinopril (PRINIVIL, ZESTRIL) 40 mg tablet Take 1 Tab by mouth twice daily. (Patient taking differently: Take 2.5 mg by mouth daily.)   ? LORATADINE/PSEUDOEPHEDRINE (CLARITIN-D 12 HOUR PO) Take  by mouth as Needed.   ? warfarin (COUMADIN) 2 mg tablet Take one tablet by mouth daily. 6 days a week  Indications: 6 day, 0 po on 7th day       There is no height or weight on file to calculate BMI.  Pain Addressed:  N/A    Patient Evaluated for a Clinical Trial: Patient not eligible for a treatment trial (including not needing treatment, needs palliative care, in remission).     [place] Cooperative Oncology Group performance status is 1, Restricted in physically strenuous activity but ambulatory and able to carry out work of a light or sedentary nature, e.g., light house work, office work.     Physical Exam       In general, the patient is alert and pleasant  No scleral icterus; no injection of the sclera  No neck lymphadenopathy or supraclavicular lymphadenopathy  No thyroid masses  No oral thrush or buccal petechiae  Lungs are clear without wheezing or dullness  Heart is regular, with no murmur  Abdomen is soft, non-tender, with normal bowel sounds, and no pain or mass  Extremities show no edema or tenderness  No rash or petechiae  Normal gait; normal cranial nerves           Assessment and Plan:    1. History of PE and DVT: Cont coumadin at 2 mg; 6 days a week.     Setting up for our office.   Not tested frequently.     INR 5.1 today  To skip two nights      2. Metastatic breast cancer history: The patient was originally diagnosed with right breast cancer in 1995, T2N1M0, 3/32 positive lymph nodes. Tumor was ER/PR positive, HER-2 Positive. She received chemotherapy with Adriamycin and Cytoxan for four cycles and then received five years of tamoxifen. 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  She had an intramedullary rod placed in the right femur and had radiation. She underwent 6 cycles of Taxotere and Herceptin, and was placed on Aredia and Arimidex.     Remains on Anastrazole (Arimidex).  She has had no clinical recurrence, and has a normal tumor marker in Dec 2019.     No signs of clinical recurrence.     Tumor marker pending       3. Slight stable murmur; no signs of CHF    4. Elevated BP is controlled  140/56 today  Saw Cardiology 2 weeks ago    5. Seasonal allergies; using claritin D prn.   Can use ocular antihistamine OTC if needed    6. Arm port in place: Flush at least every 3 months, I have asked for more often.         RTC in 6 months.

## 2019-03-15 NOTE — Progress Notes
Pt here for labs and a port flush. Pt voices no complaints. Port accessed x2 attempts. +BR noted and labs drawn. Port flushed and de accessed. Pt sent to see Dr. Celesta Aver.

## 2019-03-30 ENCOUNTER — Encounter: Admit: 2019-03-30 | Discharge: 2019-03-30 | Payer: MEDICARE

## 2019-03-30 DIAGNOSIS — I359 Nonrheumatic aortic valve disorder, unspecified: Secondary | ICD-10-CM

## 2019-03-31 ENCOUNTER — Encounter: Admit: 2019-03-31 | Discharge: 2019-03-31 | Payer: MEDICARE

## 2019-04-02 MED ORDER — WARFARIN 2 MG PO TAB
ORAL_TABLET | ORAL | 1 refills | 90.00000 days | Status: DC
Start: 2019-04-02 — End: 2019-09-13

## 2019-05-13 ENCOUNTER — Encounter: Admit: 2019-05-13 | Discharge: 2019-05-13 | Payer: MEDICARE

## 2019-05-16 MED ORDER — ANASTROZOLE 1 MG PO TAB
ORAL_TABLET | Freq: Every day | ORAL | 3 refills | 33.00000 days | Status: AC
Start: 2019-05-16 — End: ?

## 2019-06-29 ENCOUNTER — Encounter: Admit: 2019-06-29 | Discharge: 2019-06-29 | Payer: MEDICARE

## 2019-06-29 DIAGNOSIS — C50811 Malignant neoplasm of overlapping sites of right female breast: Secondary | ICD-10-CM

## 2019-09-13 ENCOUNTER — Encounter: Admit: 2019-09-13 | Discharge: 2019-09-13 | Payer: PRIVATE HEALTH INSURANCE

## 2019-09-13 ENCOUNTER — Encounter: Admit: 2019-09-13 | Discharge: 2019-09-13 | Payer: MEDICARE

## 2019-09-13 DIAGNOSIS — C50811 Malignant neoplasm of overlapping sites of right female breast: Secondary | ICD-10-CM

## 2019-09-13 DIAGNOSIS — C50919 Malignant neoplasm of unspecified site of unspecified female breast: Secondary | ICD-10-CM

## 2019-09-13 DIAGNOSIS — D619 Aplastic anemia, unspecified: Secondary | ICD-10-CM

## 2019-09-13 DIAGNOSIS — Z7901 Long term (current) use of anticoagulants: Secondary | ICD-10-CM

## 2019-09-13 DIAGNOSIS — I359 Nonrheumatic aortic valve disorder, unspecified: Secondary | ICD-10-CM

## 2019-09-13 DIAGNOSIS — Z95828 Presence of other vascular implants and grafts: Secondary | ICD-10-CM

## 2019-09-13 DIAGNOSIS — D649 Anemia, unspecified: Secondary | ICD-10-CM

## 2019-09-13 LAB — CBC AND DIFF
Lab: 10 g/dL — ABNORMAL LOW (ref 12.0–15.0)
Lab: 3.3 M/UL — ABNORMAL LOW (ref 4.0–5.0)
Lab: 4.9 K/UL (ref 4.5–11.0)

## 2019-09-13 LAB — COMPREHENSIVE METABOLIC PANEL
Lab: 142 MMOL/L (ref 137–147)
Lab: 3.8 MMOL/L (ref 3.5–5.1)

## 2019-09-13 NOTE — Progress Notes
Patient presents for lab draw/port flush. Port located in L upper arm, membrane points towards patient (needle for access needs to point down toward patient's hand). Patient denies questions about plan of care, denies concerning symptoms. Tolerated port access and lab draw well. Flushes easily with brisk blood return. Heparin locked and de -accessed. Patient left treatment area ambulatory and in baseline condition, to see MD Gerarda Fraction in clinic today.

## 2019-09-13 NOTE — Patient Instructions
[facility]  Discharge Instructions      Sydney Mcconnell  09/13/2019    Treatment Received Today:  Lab draw, port flush            Discharge Instructions  Call immediately to report the following:  ? Uncontrolled nausea or vomiting, pain, or bleeding  ? Temperature of 100.5 F or greater or any sign/symptom of infection (warmth, redness, tenderness)  ? Painful mouth or difficulty swallowing  ? Diarrhea   ? Swelling of arms or legs  ? Rash     Post-Treatment Directions:  ? Use mouth rinses after meals and at bedtime.  Use a non-alcohol commercial brand rinse   or mild salt water/baking soda rinse.    ? Drink 8-10 glasses of fluids daily.  ? Try to exercise daily to decrease fatigue.      Medication Instructions  If there are any specific medication instructions they are written below          Phone Numbers  Cancer Center Phone # [phone]    (Answered 24 hrs a day)    For up to date information on the COVID-19 virus, visit the [facility] website. [email or web address]  ? General supportive care during cold and flu season and infection prevention reminders:    o Wash hands often with soap and water for at least 20 seconds   o Cover your mouth and nose   o Social distancing: try to maintain 6 feet between you and other people   o Stay home if sick and symptoms mild or manageable?  ? If you must be around people wear a mask    ? If you are having symptoms of a lower respiratory infection (cough, shortness of breath) and/or fever AND either traveled in last 30 days (internationally or to region of exposure) OR known exposure to patient with COVID19:     o Call your primary care provider for questions or health needs.   ? Tell your doctor about your recent travel and your symptoms     o In a medical emergency, call 911 or go to the nearest emergency room.

## 2019-09-13 NOTE — Progress Notes
Date of Service: 09/13/2019      Subjective:             Reason for Visit:    Sydney Mcconnell is a 69 y.o. female.    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 Tumor was ER/PR positive, HER-2 Positive. She received chemotherapy with Adriamycin and Cytoxan for four cycles and then received five years of tamoxifen.    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  She had an intramedullary rod placed in the right femur and had radiation. She underwent 6 cycles of Taxotere and Herceptin, and was placed on Aredia and Arimidex. The Aredia was eventually switched to Zometa.     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 She underwent debridement, 20 hyperbaric oxygen therapies, and was dramatically improved. The process essentially arrested in 2008.     In 2010 she had the right femur rod replaced as it was bending. Intramedullary rod replaced again in Jan 2011 when she tripped and was found to have a linear nondisplaced fracture of the lateral cortex of the proximal R diaphysis corresponding to the focus of increased enhancement on the bone scan.    March 30, 2012 she had a fall and broke left femur. A rod was placed there. She had a long period of PT.     She had done well until May 2014 when she felt she pulled a muscle or broke a bone in her rib or scapula and had been in extreme pain not relieved by NSAIDS. Bone scan 09/26/12 showed an area of increased uptake in left posterior rib in area of discomfort. Compatible with small rib fracture (patient carrying a box when it occurred ). Plain rib films negative for fracture or mass. This pain was in the right back, under the scapula. Tumor markers did not rise.    Due to delayed union of a left subtrochanteric femur fracture, she met back with Dr Cherene Julian in Orthopedic surgery. She had dynamization (removal) of the left trochanteric femoral nail on 01/01/13.     She had follow-up with Dr Cherene Julian May 2015. X-rays show significant healing since 12/2012 x-rays.     Ongoing back pain after a snap sound in early July 2104:    CT chest done 11/02/12:  There are no pathologically enlarged mediastinal or hilar lymph nodes, though limited evaluation in the absence of IV contrast.   There is no new or enlarging pulmonary nodule.  There are no aggressive osseous lesions or displaced rib fracture. An old right posterior seventh rib fracture is seen. There is exaggeration of the normal thoracic kyphosis. Prior right mastectomy and flap reconstruction is again noted without axillary lymphadenopathy.    Mammograms 2015 to 2018 on record     Had an ER visit in July 2018 with an episode of right jaw numbness lasting 6 hours - CT head was ok.     Mammogram 09/03/17:  There are scattered areas of fibroglandular density. ?2D, and 3D   images were obtained. No masses,  densities or calcifications to   suggest malignancy. No change when compared to prior studies.          Past Medical History: Episodic tachycardia; follows with cardiology. 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     Family History: Mother died with CHF age 75. 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             INTERVAL HISTORY:    She has a metastatic breast cancer history, but no recent bone progression.     Here for a 6 month follow up.      She has had CABG 7 weeks ago at [facility] in [place].     She is now on Xarelto. History of DVT. No new leg pains.  Had been on 2 mg of coumadin long term    Continues on Arimidex.    Tumor marker is pending. CA 27.29 within normal limits in Nov 2020.     Mammogram March 2021:        Still having lymphedema of right arm, chronic, unchanged. Using compression sleeves and more helpful. Has a port in left arm. Functioning well when she gets blood draws.     No pain meds needed. Back and leg much improved.     Vision is poor at baseline. She will get some vertigo.     Always sleeps sitting up - this is a chronic issue for years. Rare edema of legs.         HGB was down to 7-8 post op.      Ref. Range 03/15/2019 12:01 06/28/2019 00:00 09/13/2019 11:03   Hemoglobin Latest Ref Range: 12.0 - 15.0 GM/DL 21.3  08.6 (L)   Hematocrit Latest Ref Range: 36 - 45 % 38.0  31.2 (L)   Platelet Count Latest Ref Range: 150 - 400 K/UL 208  207   White Blood Cells Latest Ref Range: 4.5 - 11.0 K/UL 5.8  4.9          Review of Systems    No headaches or vision changes  No swallowing problems  No new lymph nodes enlarged  No bruising or petechiae  No breathing difficulty  The patient denies chest pain or pleurisy  No abdominal pain  No change in bowel habits or gastrointestinal bleeding  No hematuria  There has been no change in arthralgias  No new skin rashes  No leg edema noted      Objective:         ? amLODIPine (NORVASC) 10 mg tablet Take 1 Tab by mouth daily. (Patient taking differently: Take 10 mg by mouth daily. 1/2 po bid)   ? anastrozole (ARIMIDEX) 1 mg tablet TAKE 1 TABLET BY MOUTH EVERY DAY   ? Calcium Carbonate 600 mg (1,500 mg) tab Take 2 Tabs by mouth daily.   ? CARVEDILOL (COREG PO) Take  by mouth. Indications: 1/2 po bid   ? cholecalciferol (VITAMIN D-3) 400 unit tab Take 400 Units by mouth twice daily.   ? CYCLOBENZAPRINE HCL (FLEXERIL PO) Take 0.5 mg by mouth as Needed.   ? dextromethorphan/pseudoephed (PSEUDOEPHEDRINE-DM PO) Take  by mouth.   ? IBUPROFEN IB PO Take  by mouth.   ? lisinopril (PRINIVIL, ZESTRIL) 40 mg tablet Take 1 Tab by mouth twice daily. (Patient taking differently: Take  2.5 mg by mouth daily.)   ? LORATADINE/PSEUDOEPHEDRINE (CLARITIN-D 12 HOUR PO) Take  by mouth as Needed.   ? warfarin (COUMADIN) 2 mg tablet TAKE 1 TABLET BY MOUTH 6 TIMES WEEKLY       There is no height or weight on file to calculate BMI.             Pain Addressed:  N/A    Patient Evaluated for a Clinical Trial: Patient not eligible for a treatment trial (including not needing treatment, needs palliative care, in remission).     [place] Cooperative Oncology Group performance status is 1, Restricted in physically strenuous activity but ambulatory and able to carry out work of a light or sedentary nature, e.g., light house work, office work.     Physical Exam       In general, the patient is alert and pleasant  No scleral icterus; no injection of the sclera  No neck lymphadenopathy or supraclavicular lymphadenopathy  No thyroid masses  No oral thrush or buccal petechiae  Lungs are clear without wheezing or dullness  Heart is regular, with no murmur  Abdomen is soft, non-tender, with normal bowel sounds, and no pain or mass  Extremities show no edema or tenderness  No rash or petechiae  Normal gait; normal cranial nerves         Assessment and Plan:    1. History of PE and DVT:     Currently on Xarelto after her CABG 7 weeks ago  Would prefer the Xarelto for one month  Can return to coumadin      2. Metastatic breast cancer history: The patient was originally diagnosed with right breast cancer in 1995, T2N1M0, 3/32 positive lymph nodes. Tumor was ER/PR positive, HER-2 Positive. She received chemotherapy with Adriamycin and Cytoxan for four cycles and then received five years of tamoxifen.    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  She had an intramedullary rod placed in the right femur and had radiation. She underwent 6 cycles of Taxotere and Herceptin, and was placed on Aredia and Arimidex.     Remains on Anastrazole (Arimidex).  She has had no clinical recurrence, and has a normal tumor marker in Nov 2020    No signs of clinical recurrence.     Tumor marker pending       3. Slight stable murmur; no signs of CHF    4. Now recovering from CABG.     Cardiac rehab planned.     5. Seasonal allergies; using claritin D prn.   Can use ocular antihistamine OTC if needed    6. Arm port in place: Flush at least every 3 months, I have asked her to do this more often.     7. Anemia post op - was worse, and now improving.     8. Osteopenia on DEXA May 2020        RTC in 1 month for INR check (if Cardiololgy is ok with her changing back to coumadin)

## 2019-09-19 ENCOUNTER — Encounter: Admit: 2019-09-19 | Discharge: 2019-09-19 | Payer: MEDICARE

## 2019-09-19 DIAGNOSIS — I359 Nonrheumatic aortic valve disorder, unspecified: Secondary | ICD-10-CM

## 2019-09-19 MED ORDER — WARFARIN 2 MG PO TAB
ORAL_TABLET | ORAL | 1 refills | 90.00000 days | Status: AC
Start: 2019-09-19 — End: ?

## 2019-11-04 ENCOUNTER — Encounter: Admit: 2019-11-04 | Discharge: 2019-11-04 | Payer: MEDICARE

## 2019-11-05 ENCOUNTER — Encounter: Admit: 2019-11-05 | Discharge: 2019-11-05 | Payer: MEDICARE

## 2019-11-05 DIAGNOSIS — L659 Nonscarring hair loss, unspecified: Secondary | ICD-10-CM

## 2019-11-05 DIAGNOSIS — C50811 Malignant neoplasm of overlapping sites of right female breast: Secondary | ICD-10-CM

## 2019-11-05 DIAGNOSIS — R5383 Other fatigue: Secondary | ICD-10-CM

## 2019-11-05 NOTE — Progress Notes
Patient will have daughter call to scheduled.

## 2020-02-14 ENCOUNTER — Encounter: Admit: 2020-02-14 | Discharge: 2020-02-14 | Payer: MEDICARE

## 2020-03-06 ENCOUNTER — Encounter: Admit: 2020-03-06 | Discharge: 2020-03-06 | Payer: PRIVATE HEALTH INSURANCE

## 2020-03-06 ENCOUNTER — Encounter: Admit: 2020-03-06 | Discharge: 2020-03-06 | Payer: MEDICARE

## 2020-03-06 DIAGNOSIS — Z95828 Presence of other vascular implants and grafts: Secondary | ICD-10-CM

## 2020-03-06 MED ORDER — HEPARIN, PORCINE (PF) 100 UNIT/ML IV SYRG
500 [IU] | Freq: Once | 0 refills | Status: CP
Start: 2020-03-06 — End: ?

## 2020-03-06 NOTE — Patient Instructions
[facility]  Discharge Instructions      Sydney Mcconnell  03/06/2020    Treatment Received Today:  Port flush             Discharge Instructions  Call immediately to report the following:   Uncontrolled nausea or vomiting, pain, or bleeding   Temperature of 100.5 F or greater or any sign/symptom of infection (warmth, redness, tenderness)   Painful mouth or difficulty swallowing   Diarrhea    Swelling of arms or legs   Rash     Post-Treatment Directions:   Use mouth rinses after meals and at bedtime.  Use a non-alcohol commercial brand rinse   or mild salt water/baking soda rinse.     Drink 8-10 glasses of fluids daily.   Try to exercise daily to decrease fatigue.      Medication Instructions  If there are any specific medication instructions they are written below          Phone Numbers  [place] Phone # [phone]    (Answered 24 hrs a day)    For up to date information on the COVID-19 virus, visit the [facility] website. http://www.black-smith.org/   General supportive care during cold and flu season and infection prevention reminders:    o Wash hands often with soap and water for at least 20 seconds   o Cover your mouth and nose   o Social distancing: try to maintain 6 feet between you and other people   o Stay home if sick and symptoms mild or manageable?   If you must be around people wear a mask     If you are having symptoms of a lower respiratory infection (cough, shortness of breath) and/or fever AND either traveled in last 30 days (internationally or to region of exposure) OR known exposure to patient with COVID19:     o Call your primary care provider for questions or health needs.    Tell your doctor about your recent travel and your symptoms     o In a medical emergency, call 911 or go to the nearest emergency room.

## 2020-03-06 NOTE — Progress Notes
Patient presents for port flush (no labs ordered). Patient denies questions about plan of care, denies concerning symptoms. 2 attempts unsuccessful, 3rd attempt successful, port flushes easily with brisk blood return noted. Flushed and heparin locked, patient left treatment area ambulatory and in baseline condition.

## 2020-03-26 ENCOUNTER — Encounter: Admit: 2020-03-26 | Discharge: 2020-03-26 | Payer: PRIVATE HEALTH INSURANCE

## 2020-03-26 DIAGNOSIS — C50919 Malignant neoplasm of unspecified site of unspecified female breast: Secondary | ICD-10-CM

## 2020-03-26 DIAGNOSIS — Z5181 Encounter for therapeutic drug level monitoring: Secondary | ICD-10-CM

## 2020-03-26 DIAGNOSIS — I2510 Atherosclerotic heart disease of native coronary artery without angina pectoris: Secondary | ICD-10-CM

## 2020-03-26 DIAGNOSIS — I480 Paroxysmal atrial fibrillation: Secondary | ICD-10-CM

## 2020-03-26 NOTE — Progress Notes
Name: Sydney Mcconnell          MRN: [id number]      DOB: Oct 14, 1950      AGE: 69 y.o.   DATE OF SERVICE: 03/26/2020    Subjective:             Reason for Visit:  No chief complaint on file.      Sydney Mcconnell is a 69 y.o. female.     Cancer Staging  Malignant neoplasm of overlapping sites of right female breast [facility])  Staging form: Breast, AJCC 6th Edition  - Clinical: No stage assigned - Unsigned  - Pathologic: Stage IIB (T2, N1, MX) - Unsigned      History of Present Illness    Obtained patient's verbal consent to treat them and their agreement to [facility] financial policy and NPP via this telehealth visit during the [employer] Emergency  ?  Time in review of chart, previous labs: 6 min    Time on video call: 15 min  ?  ?  ?  CANCER HISTORY: The patient was originally diagnosed with right breast cancer in 1995, T2N1M0, 3/32 positive lymph nodes. Tumor was ER/PR positive, HER-2 Positive. She received chemotherapy with Adriamycin and Cytoxan for four cycles and then received five years of tamoxifen.  ?  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  She had an intramedullary rod placed in the right femur and had radiation. She underwent 6 cycles of Taxotere and Herceptin, and was placed on Aredia and Arimidex. The Aredia was eventually switched to Zometa.   ?  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 She underwent debridement, 20 hyperbaric oxygen therapies, and was dramatically improved. The process essentially arrested in 2008.   ?  In 2010 she had the right femur rod replaced as it was bending. Intramedullary rod replaced again in Jan 2011 when she tripped and was found to have a linear nondisplaced fracture of the lateral cortex of the proximal R diaphysis corresponding to the focus of increased enhancement on the bone scan.  ?  March 30, 2012 she had a fall and broke left femur. A rod was placed there. She had a long period of PT.   ?  She had done well until May 2014 when she felt she pulled a muscle or broke a bone in her rib or scapula and had been in extreme pain not relieved by NSAIDS. Bone scan 09/26/12 showed an area of increased uptake in left posterior rib in area of discomfort. Compatible with small rib fracture (patient carrying a box when it occurred ). Plain rib films negative for fracture or mass. This pain was in the right back, under the scapula. Tumor markers did not rise.  ?  Due to delayed union of a left subtrochanteric femur fracture, she met back with Dr Cherene Julian in Orthopedic surgery. She had dynamization (removal) of the left trochanteric femoral nail on 01/01/13.     She had follow-up with Dr Cherene Julian May 2015. X-rays show significant healing since 12/2012 x-rays.   ?  Ongoing back pain after a snap sound in early July 2104:    CT chest done 11/02/12:  There are no pathologically enlarged mediastinal or hilar lymph nodes, though limited evaluation in the absence of IV contrast.   There is no new or enlarging pulmonary nodule.  There are no aggressive osseous  lesions or displaced rib fracture. An old right posterior seventh rib fracture is seen. There is exaggeration of the normal thoracic kyphosis. Prior right mastectomy and flap reconstruction is again noted without axillary lymphadenopathy.  ?  Mammograms 2015 to 2018 on record   ?  Had an ER visit in July 2018 with an episode of right jaw numbness lasting 6 hours - CT head was ok.   ?  Mammogram 09/03/17:  There are scattered areas of fibroglandular density. ?2D, and 3D   images were obtained. No masses, densities or calcifications to   suggest malignancy. No change when compared to prior studies.  ?  ?  ?  ?  Past Medical History: Episodic tachycardia; follows with cardiology. 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   ?  Family History: Mother died with CHF age 56. Sister with negative BRCA testing, but died in 01-17-2015with breast and colon cancer  ?  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   ?  ?  ?  ?  ?  INTERVAL HISTORY:  ?  She has a metastatic breast cancer history, but no recent bone progression.   ?  Here for a 6 month follow up.  Telehealth today.   ?  She has had CABG in April 2021 at [facility] in [place].     She discovered she was in A fib for several months. Ablation tried in October - out of A fib now.   ?  She is now on Xarelto.   History of DVT. No new leg pains.  She would prefer coumadin  ?  Continues on Arimidex.  ?  No new bone pains.   Walking well now  ?  Tumor marker was normal 09/13/19  ?  ? Ref. Range 09/13/2019 11:03   CA27.29 Latest Units: U/mL 19.1   ?  ?  Still having lymphedema of right arm, chronic, unchanged. Using compression sleeves and more helpful. Has a port in left arm. Functioning well when she gets blood draws.   ?  No pain meds needed. Back and leg much improved.   ?  No more cp; fatigue is better. She was really debilitated with A Fib.   ?  Vision is poor at baseline. She will get some vertigo.   ?  Always sleeps sitting up - this is a chronic issue for years. Rare edema of legs.   ?  ?  ?  Mammogram March 2021:    ?  ?  HGB was down to 7-8 post op.   ?  ? Ref. Range 03/15/2019 12:01 06/28/2019 00:00 09/13/2019 11:03   Hemoglobin Latest Ref Range: 12.0 - 15.0 GM/DL 27.7 ? 82.4 (L)   Hematocrit Latest Ref Range: 36 - 45 % 38.0 ? 31.2 (L)   Platelet Count Latest Ref Range: 150 - 400 K/UL 208 ? 207   White Blood Cells Latest Ref Range: 4.5 - 11.0 K/UL 5.8 ? 4.9   ?       Review of Systems    No headaches or vision changes  No swallowing problems  No new lymph nodes enlarged  No bruising or petechiae  No breathing difficulty  The patient denies chest pain or pleurisy  No abdominal pain  No change in bowel habits or gastrointestinal bleeding  No hematuria  There has been no change in arthralgias  No new skin rashes  No leg  edema noted  Objective:         ? amiodarone (CORDARONE) 200 mg tablet    ? anastrozole (ARIMIDEX) 1 mg tablet TAKE 1 TABLET BY MOUTH EVERY DAY   ? aspirin 81 mg chewable tablet Chew 81 mg by mouth daily. Take with food.   ? atorvastatin (LIPITOR) 20 mg tablet    ? Calcium Carbonate 600 mg (1,500 mg) tab Take 2 Tabs by mouth daily.   ? CARVEDILOL (COREG PO) Take  by mouth. Indications: 1/2 po bid   ? cholecalciferol (VITAMIN D-3) 400 unit tab Take 400 Units by mouth twice daily.   ? warfarin (COUMADIN) 2 mg tablet TAKE 1 TABLET BY MOUTH 6 TIMES WEEKLY   ? XARELTO 20 mg tablet      There were no vitals filed for this visit.  There is no height or weight on file to calculate BMI.                  Pain Addressed:  N/A    Patient Evaluated for a Clinical Trial: No treatment clinical trial available for this patient.     [place] Cooperative Oncology Group performance status is 0, Fully active, able to carry on all pre-disease performance without restriction.Marland Kitchen     Physical Exam     In general, the patient is alert and pleasant     Assessment and Plan:  1. History of PE and DVT:   ?  Currently on Xarelto after her CABG   ?  Can return to coumadin if she likes  ?  ?  2. Metastatic breast cancer history: The patient was originally diagnosed with right breast cancer in 1995, T2N1M0, 3/32 positive lymph nodes. Tumor was ER/PR positive, HER-2 Positive. She received chemotherapy with Adriamycin and Cytoxan for four cycles and then received five years of tamoxifen.  ?  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  She had an intramedullary rod placed in the right femur and had radiation. She underwent 6 cycles of Taxotere and Herceptin, and was placed on Aredia and Arimidex.   ?  Remains on Anastrazole (Arimidex).  She has had no clinical recurrence, and has a normal tumor marker in Nov 2020  ?  No signs of clinical recurrence.   ?  Tumor marker normal in May 2021  ?  ?  3. Slight stable murmur; no signs of CHF  ?  4. CAD: sp CABG.   ?  Cardiac rehab to start tomorrow  ?  Had A fib, and an ablation  She is still worn out - PT and rehab will help  ?  ?  5. Seasonal allergies; using claritin D prn.   Can use ocular antihistamine OTC if needed  ?  6. Arm port in place: Flush at least every 3 months, I have asked her to do this more often.   ?  7. Anemia post op - was worse, and now improving.   ?  8. Osteopenia on DEXA May 2020  ?  ?  ?  RTC in 2 months for port flush with labs  I will see her back in 6 months

## 2020-06-14 MED ORDER — ANASTROZOLE 1 MG PO TAB
ORAL_TABLET | Freq: Every day | 3 refills
Start: 2020-06-14 — End: ?

## 2020-06-23 ENCOUNTER — Encounter: Admit: 2020-06-23 | Discharge: 2020-06-23 | Payer: MEDICARE

## 2020-06-25 ENCOUNTER — Encounter: Admit: 2020-06-25 | Discharge: 2020-06-25 | Payer: MEDICARE

## 2020-06-25 ENCOUNTER — Encounter: Admit: 2020-06-25 | Discharge: 2020-06-25 | Payer: PRIVATE HEALTH INSURANCE

## 2020-06-25 DIAGNOSIS — Z5181 Encounter for therapeutic drug level monitoring: Secondary | ICD-10-CM

## 2020-06-25 DIAGNOSIS — R5383 Other fatigue: Secondary | ICD-10-CM

## 2020-06-25 DIAGNOSIS — I480 Paroxysmal atrial fibrillation: Secondary | ICD-10-CM

## 2020-06-25 DIAGNOSIS — Z95828 Presence of other vascular implants and grafts: Secondary | ICD-10-CM

## 2020-06-25 DIAGNOSIS — C50811 Malignant neoplasm of overlapping sites of right female breast: Secondary | ICD-10-CM

## 2020-06-25 LAB — CBC AND DIFF
Lab: 0.1 K/UL (ref 0–0.20)
Lab: 0.3 K/UL (ref 0–0.45)
Lab: 0.6 K/UL (ref 0–0.80)
Lab: 0.8 K/UL — ABNORMAL LOW (ref 1.0–4.8)
Lab: 1 % (ref 0–2)
Lab: 10 % (ref 4–12)
Lab: 13 % — ABNORMAL LOW (ref 24–44)
Lab: 18 % — ABNORMAL HIGH (ref 11–15)
Lab: 23 pg — ABNORMAL LOW (ref 26–34)
Lab: 248 K/UL (ref 150–400)
Lab: 3.3 M/UL — ABNORMAL LOW (ref 4.0–5.0)
Lab: 30 g/dL — ABNORMAL LOW (ref 32.0–36.0)
Lab: 4.3 K/UL (ref 1.8–7.0)
Lab: 5 % — ABNORMAL LOW (ref >60)
Lab: 6.1 K/UL (ref 4.5–11.0)
Lab: 7.5 FL (ref 7–11)
Lab: 7.7 g/dL — ABNORMAL LOW (ref 12.0–15.0)
Lab: 71 % (ref 41–77)

## 2020-06-25 LAB — COMPREHENSIVE METABOLIC PANEL
Lab: 139 MMOL/L (ref 137–147)
Lab: 4.7 MMOL/L (ref 3.5–5.1)

## 2020-06-25 MED ORDER — HEPARIN, PORCINE (PF) 100 UNIT/ML IV SYRG
500 [IU] | Freq: Once | 0 refills | Status: CP
Start: 2020-06-25 — End: ?

## 2020-06-25 NOTE — Patient Instructions
[facility]  Discharge Instructions      Sydney Mcconnell  06/25/2020    Treatment Received Today:  Lab draw/port flush             Discharge Instructions  Call immediately to report the following:   Uncontrolled nausea or vomiting, pain, or bleeding   Temperature of 100.5 F or greater or any sign/symptom of infection (warmth, redness, tenderness)   Painful mouth or difficulty swallowing   Diarrhea    Swelling of arms or legs   Rash     Post-Treatment Directions:   Use mouth rinses after meals and at bedtime.  Use a non-alcohol commercial brand rinse   or mild salt water/baking soda rinse.     Drink 8-10 glasses of fluids daily.   Try to exercise daily to decrease fatigue.      Medication Instructions  If there are any specific medication instructions they are written below          Phone Numbers  [place] Phone # [phone]    (Answered 24 hrs a day)    For up to date information on the COVID-19 virus, visit the [facility] website. http://www.black-smith.org/   General supportive care during cold and flu season and infection prevention reminders:    o Wash hands often with soap and water for at least 20 seconds   o Cover your mouth and nose   o Social distancing: try to maintain 6 feet between you and other people   o Stay home if sick and symptoms mild or manageable?   If you must be around people wear a mask     If you are having symptoms of a lower respiratory infection (cough, shortness of breath) and/or fever AND either traveled in last 30 days (internationally or to region of exposure) OR known exposure to patient with COVID19:     o Call your primary care provider for questions or health needs.    Tell your doctor about your recent travel and your symptoms     o In a medical emergency, call 911 or go to the nearest emergency room.

## 2020-06-25 NOTE — Progress Notes
Patient presents for lab draw/port flush. Patient denies questions about plan of care, denies concerning symptoms. Tolerated port access x2 attempts well, labs drawn and port heparin locked/de-accessed. Left treatment area ambulatory with accompaniment and in baseline condition.

## 2020-07-03 ENCOUNTER — Encounter: Admit: 2020-07-03 | Discharge: 2020-07-03 | Payer: MEDICARE

## 2020-07-03 DIAGNOSIS — C50919 Malignant neoplasm of unspecified site of unspecified female breast: Secondary | ICD-10-CM

## 2020-07-03 DIAGNOSIS — D649 Anemia, unspecified: Secondary | ICD-10-CM

## 2020-07-08 ENCOUNTER — Encounter: Admit: 2020-07-08 | Discharge: 2020-07-08 | Payer: PRIVATE HEALTH INSURANCE

## 2020-07-08 DIAGNOSIS — I10 Essential (primary) hypertension: Secondary | ICD-10-CM

## 2020-07-08 DIAGNOSIS — D6859 Other primary thrombophilia: Secondary | ICD-10-CM

## 2020-07-08 DIAGNOSIS — D649 Anemia, unspecified: Secondary | ICD-10-CM

## 2020-07-08 DIAGNOSIS — D630 Anemia in neoplastic disease: Secondary | ICD-10-CM

## 2020-07-08 DIAGNOSIS — C50919 Malignant neoplasm of unspecified site of unspecified female breast: Secondary | ICD-10-CM

## 2020-07-08 DIAGNOSIS — I482 Chronic atrial fibrillation, unspecified: Secondary | ICD-10-CM

## 2020-07-08 DIAGNOSIS — T148XXA Other injury of unspecified body region, initial encounter: Secondary | ICD-10-CM

## 2020-07-08 DIAGNOSIS — C50811 Malignant neoplasm of overlapping sites of right female breast: Secondary | ICD-10-CM

## 2020-07-08 DIAGNOSIS — R0602 Shortness of breath: Secondary | ICD-10-CM

## 2020-07-08 DIAGNOSIS — I351 Nonrheumatic aortic (valve) insufficiency: Secondary | ICD-10-CM

## 2020-07-08 DIAGNOSIS — I25810 Atherosclerosis of coronary artery bypass graft(s) without angina pectoris: Secondary | ICD-10-CM

## 2020-07-08 DIAGNOSIS — E669 Obesity, unspecified: Secondary | ICD-10-CM

## 2020-07-08 DIAGNOSIS — I82409 Acute embolism and thrombosis of unspecified deep veins of unspecified lower extremity: Secondary | ICD-10-CM

## 2020-07-08 DIAGNOSIS — M8589 Other specified disorders of bone density and structure, multiple sites: Secondary | ICD-10-CM

## 2020-07-08 LAB — CBC AND DIFF
Lab: 0.1 K/UL (ref 0–0.20)
Lab: 0.2 K/UL (ref 0–0.45)
Lab: 0.8 K/UL (ref 0–0.80)
Lab: 0.9 K/UL — ABNORMAL LOW (ref 1.0–4.8)
Lab: 1 % (ref 0–2)
Lab: 12 % (ref 4–12)
Lab: 18 % — ABNORMAL HIGH (ref 11–15)
Lab: 23 pg — ABNORMAL LOW (ref 26–34)
Lab: 261 K/UL (ref 150–400)
Lab: 29 g/dL — ABNORMAL LOW (ref 32.0–36.0)
Lab: 3.8 M/UL — ABNORMAL LOW (ref 4.0–5.0)
Lab: 30 % — ABNORMAL LOW (ref 36–45)
Lab: 4 % (ref 0–5)
Lab: 4.2 K/UL (ref 1.8–7.0)
Lab: 6.2 K/UL (ref 4.5–11.0)
Lab: 68 % (ref 41–77)
Lab: 7.4 FL (ref 7–11)
Lab: 77 FL — ABNORMAL LOW (ref 80–100)
Lab: 9 g/dL — ABNORMAL LOW (ref 12.0–15.0)

## 2020-07-08 MED ORDER — HEPARIN, PORCINE (PF) 100 UNIT/ML IV SYRG
500 [IU] | Freq: Once | 0 refills | Status: AC
Start: 2020-07-08 — End: ?

## 2020-07-08 NOTE — Patient Instructions
Discharge Instructions: Caring for Your Central Line  You are going home with a central line. It's also called a central venous access device (CVAD) or central venous catheter (CVC). A small, soft tube (catheter) has been put in a vein that leads to your heart. This provides medicine during your treatment. It's taken out when you no longer need it. At home, you need to take care of your central line to keep it working. A central line has a high infection risk. So you must take extra care washing your hands and preventing the spread of germs. This sheet will help you remember what to do at home.   Understanding your role   A nurse or other healthcare provider will teach you and your caregivers how to care for the central line. Before leaving the hospital, make sure you understand what to do at home, how long you may need the central line, and when to have a follow-up visit.   You will likely be told to flush the central line with saline or heparin solution. You may also be told to change the catheter's injection caps and change the bandage (dressing). Or, a nurse may do this for you during a follow-up visit. Only do these things if you're told to do so. Follow the instructions you were given.    Protecting the central line  If the central line gets damaged, it won't work right and could raise your chance of infection. Call your healthcare team right away if any damage occurs. To protect the central line at home:    Prevent infection. Use good hand hygiene by following the guidelines on this sheet. Don't touch the catheter or dressing unless you need to. And always clean your hands before and after you come in contact with any part of the central line. Your caregivers, family members, and any visitors should use good hand hygiene, too.   Keep the central line dry. The catheter and dressing must stay dry. Don't take baths, go swimming, use a hot tub, or do other activities that could get the central line wet.  Take a sponge bath to avoid getting the central line wet, unless your healthcare provider tells you otherwise.Ask your provider about the best way to keep the line dry when bathing or showering. If the dressing does get wet, change it only if you have been shown how. Otherwise, call your healthcare team right away for help. Have clean or sterile gloves on hand if you will be changing the dressings over the catheter.   Don't damage the catheter. Don't use any sharp or pointy objects around the catheter. This includes scissors, pins, knives, razors, or anything else that could cut it or put a hole in it (puncture it). Also, don't let anything pull or rub on the catheter, such as clothing.   Watch for signs of problems. Pay attention to how much of the catheter sticks out from your skin. If this changes at all, let your healthcare provider know. Also watch for cracks, leaks, or other damage. If the dressing becomes dirty, loose, or wet, change it (if you have been instructed to). Or call your healthcare team right away.   Don't lower your chest below your waist. This includes bending at the waist to do things like tying your shoes. When your chest is below your waist, especially for a long time, the catheter's internal tip could slip out of place in the vein.   Tell your healthcare team if you vomit   or have severe coughing. This can also make the catheter slip out of place.  Risk for blood clot  If a blood clot forms it can block blood flow through the vein where the catheter is placed. Signs of a blood clot include pain or swelling in your neck, face, chest or arm. If you have any of these symptoms, call your healthcare provider right away. You may need an ultrasound exam to find the blood clot. You may also be treated with a blood thinner.    Prevent infection with good hand hygiene  A central line can let germs into your body. This can lead to serious and sometimes deadly infections. To prevent infection, it's  very important that you, your caregivers, and others around you use good hand hygiene. This means washing your hands well with soap and water, and cleaning them with alcohol-based hand gel as directed. Never touch the central line or dressing without first using one of these methods.   To wash your hands with soap and water:  1. Wet your hands with clean water. (Don't use hot water. It can cause skin irritation when you wash your hands often.)  2. Apply enough soap to cover the entire surface of your hands, including your fingers.  3. Rub your hands together briskly for at least 15 seconds. Make sure to rub the front and back of each hand up to the wrist, your fingers and fingernails, between the fingers, and each thumb.  4. Rinse your hands with clean water.  5. Dry your hands completely with a new, unused paper towel. Don't use a cloth towel or other reusable towel. These can harbor germs.  6. Use the paper towel to turn off the faucet, then throw it away. If you're in a bathroom, also use a paper towel to open the door instead of touching the handle.  When you don't have access to soap and water: Use alcohol-based hand gel to clean your hands. The gel should have at least 60% alcohol. Follow the instructions on the package. Your healthcare team can answer any questions you have about when to use hand gel, or when it's better to wash with soap and water.   When to call your healthcare provider  Call your healthcare provider right away if you have any of the following:   Pain or burning in your shoulder, chest, back, arm, or leg   Fever of 100.4 F ( 38.0C) or higher   Chills   Signs of infection at the catheter site (pain, redness, drainage, burning, or stinging)   Coughing, wheezing, or shortness of breath   A racing or irregular heartbeat   Muscle stiffness or trouble moving   Gurgling noises coming from the catheter   The catheter falls out, breaks, cracks, leaks, or has other damage  [employer] last  reviewed this educational content on 09/24/2017   2000-2021 [employer]. All rights reserved. This information is not intended as a substitute for professional medical care. Always follow your healthcare professional's instructions.

## 2020-07-08 NOTE — Progress Notes
Patient here today for nurse lab draw. Port attempted to be accessed multiple times by two RNs with no success. Labs drawn peripherally by lab technician. Pt left in stable condition to appointment with Wynelle Bourgeois NP.

## 2020-07-08 NOTE — Progress Notes
Name: Sydney Mcconnell          MRN: [id number]      DOB: 29-Jun-1950      AGE: 70 y.o.   DATE OF SERVICE: 07/08/2020    Subjective:             Reason for Visit:  Heme/Onc Care      Sydney Mcconnell is a 70 y.o. female.     Cancer Staging  Malignant neoplasm of overlapping sites of right female breast [facility])  Staging form: Breast, AJCC 6th Edition  - Clinical: No stage assigned - Unsigned  - Pathologic: Stage IIB (T2, N1, MX) - Unsigned      History of Present Illness  70 year old female patient of Dr. Gerarda Fraction who comes in for a noted lower hemoglobin from labs on 06/25/2020 associated with some shortness of breath and fatigue. She has a history of metastatic breast cancer. She was last seen via telehealth on 03/26/2020. Her Hgb was down to 7.7 on 06/25/2020 was previously 9.7 in the fall of 2021. She is unaccompanied today. Marland Kitchen     CANCER HISTORY:   The patient was originally diagnosed with right breast cancer in 1995, T2N1M0, 3/32 positive lymph nodes. Tumor was ER/PR positive, HER-2 Positive. She received chemotherapy with Adriamycin and Cytoxan for four cycles and then received five years of tamoxifen.  ?  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 ?She had an intramedullary rod placed in the right femur and had radiation. She underwent 6 cycles of Taxotere and Herceptin, and was placed on Aredia and Arimidex. The Aredia was eventually switched to Zometa.   ?  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 She underwent debridement, 20 hyperbaric oxygen therapies, and was dramatically improved. The process essentially arrested in 2008.   ?  In 2010 she had the right femur rod replaced as it was bending. Intramedullary rod replaced again in Jan 2011 when she tripped and was found to have a linear nondisplaced fracture of the lateral cortex of the proximal R diaphysis corresponding to the focus of increased enhancement on the bone scan.  ?  March 30, 2012 she had a fall and broke left femur. A rod was placed there. She had a long period of PT.   ?  She had done well until May 2014 when she felt she pulled a muscle or broke a bone in her rib or scapula and had been in extreme pain not relieved by NSAIDS. Bone scan 09/26/12 showed an area of increased uptake in left posterior rib in area of discomfort. Compatible with small rib fracture (patient carrying a box when it occurred ). Plain rib films negative for fracture or mass. This pain was in the right back, under the scapula. Tumor markers did not rise.  ?  Due to delayed union of a left subtrochanteric femur fracture, she met back with Dr Cherene Julian in Orthopedic surgery. She had dynamization (removal) of the left trochanteric femoral nail on 01/01/13.     She had follow-up with Dr Cherene Julian May 2015. X-rays show significant healing since 12/2012 x-rays.   ?  Ongoing back pain after a snap sound in early July 2104:    CT chest done 11/02/12:  There are no pathologically enlarged mediastinal or hilar lymph nodes, though limited evaluation in the absence of IV contrast.   There is no  new or enlarging pulmonary nodule.  There are no aggressive osseous lesions or displaced rib fracture. An old right posterior seventh rib fracture is seen. There is exaggeration of the normal thoracic kyphosis. Prior right mastectomy and flap reconstruction is again noted without axillary lymphadenopathy.  ?  Mammograms 2015 to 2018 on record   ?  Had an ER visit in July 2018 with an episode of right jaw numbness lasting 6 hours - CT head was ok.   ?  Mammogram 09/03/17:  There are scattered areas of fibroglandular density. ?2D, and 3D  images were obtained. No masses, densities or calcifications to   suggest malignancy. No change when compared to prior studies.  ?  She has had CABG?in 04-16-2021at?[facility] in [place].? She discovered she was in A fib for several months. Ablation tried in October 2021-- out of A fib now.?  ?  She is now on Xarelto.   History of DVT. No new leg pains.  Continues on Arimidex.  Tumor marker?was normal 09/13/19--19.1    Still having lymphedema of right arm, chronic, unchanged. Using compression sleeves and more helpful. Has a port in left arm. Functioning well when she gets blood draws.   ?  Mammogram March 2021:    ?  ?  HGB was down to 7.8 post op then up to 9.7   ??  ?  Past Medical History: Episodic tachycardia; follows with cardiology. 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   ?  Family History: Mother died with CHF age 60. Sister with negative BRCA testing, but died in 01-16-15with breast and colon cancer, 2 other sisters with breast cancer.   ?  Social: Worked as a Scientist, clinical (histocompatibility and immunogenetics); Lives independently. No tobacco, ETOH or illicit drugs  ?       Review of Systems   Constitutional: Positive for fatigue.   HENT: Negative.    Eyes: Negative.    Respiratory: Negative.  Negative for shortness of breath.    Cardiovascular: Negative.    Gastrointestinal: Positive for diarrhea (last pm ).   Genitourinary: Negative.    Musculoskeletal: Positive for gait problem.   Skin: Positive for pallor.   Psychiatric/Behavioral: Negative.          Objective:         ? amiodarone (CORDARONE) 200 mg tablet    ? anastrozole (ARIMIDEX) 1 mg tablet TAKE 1 TABLET BY MOUTH EVERY DAY   ? aspirin 81 mg chewable tablet Chew 81 mg by mouth daily. Take with food.   ? atorvastatin (LIPITOR) 20 mg tablet    ? Calcium Carbonate 600 mg (1,500 mg) tab Take 2 Tabs by mouth daily.   ? CARVEDILOL (COREG PO) Take  by mouth. Indications: 1/2 po bid   ? cholecalciferol (VITAMIN D-3) 400 unit tab Take 400 Units by mouth twice daily.   ? warfarin (COUMADIN) 2 mg tablet TAKE 1 TABLET BY MOUTH 6 TIMES WEEKLY   ? XARELTO 20 mg tablet      Vitals:    07/08/20 0950   BP: (!) 150/84   BP Source: Arm, Left Lower   Patient Position: Sitting   Pulse: 88   Resp: 20 Temp: 36.9 ?C (98.4 ?F)   TempSrc: Temporal   SpO2: 99%   Height: 170.2 cm (5' 7.01)   PainSc: Eight     Body mass index is 33.48 kg/m?Marland Kitchen     Pain Score: Eight  Pain Loc: Leg (Left)    Fatigue Scale: 0-None    Pain Addressed:  Current regimen working to control pain.    Patient Evaluated for a Clinical Trial: Patient not eligible for a treatment trial (including not needing treatment, needs palliative care, in remission).     [place] Cooperative Oncology Group performance status is 2, Ambulatory and capable of all selfcare but unable to carry out any work activities. Up and about more than 50% of waking hours.     Physical Exam  Constitutional:       General: She is not in acute distress.     Appearance: She is well-developed.   HENT:      Head: Normocephalic and atraumatic.      Nose: Nose normal.      Mouth/Throat:      Mouth: Mucous membranes are moist.   Eyes:      Extraocular Movements: Extraocular movements intact.      Pupils: Pupils are equal, round, and reactive to light.   Cardiovascular:      Rate and Rhythm: Normal rate.      Heart sounds: Murmur heard.   Pulmonary:      Effort: Pulmonary effort is normal.      Breath sounds: Normal breath sounds.   Abdominal:      General: Bowel sounds are normal.      Palpations: Abdomen is soft.   Musculoskeletal:         General: Normal range of motion.      Cervical back: Normal range of motion and neck supple.      Right lower leg: No edema.      Left lower leg: No edema.   Skin:     General: Skin is warm and dry.   Neurological:      General: No focal deficit present.      Mental Status: She is alert and oriented to person, place, and time.   Psychiatric:         Behavior: Behavior normal.         Thought Content: Thought content normal.         Judgment: Judgment normal.       CBC w diff    Lab Results   Component Value Date/Time    WBC 6.2 07/08/2020 10:23 AM    RBC 3.87 (L) 07/08/2020 10:23 AM    HGB 9.0 (L) 07/08/2020 10:23 AM    HCT 30.1 (L) 07/08/2020 10:23 AM    MCV 77.8 (L) 07/08/2020 10:23 AM    MCH 23.3 (L) 07/08/2020 10:23 AM    MCHC 29.9 (L) 07/08/2020 10:23 AM    RDW 18.8 (H) 07/08/2020 10:23 AM    PLTCT 261 07/08/2020 10:23 AM    MPV 7.4 07/08/2020 10:23 AM    Lab Results   Component Value Date/Time    NEUT 68 07/08/2020 10:23 AM    ANC 4.20 07/08/2020 10:23 AM    LYMA 15 (L) 07/08/2020 10:23 AM    ALC 0.90 (L) 07/08/2020 10:23 AM    MONA 12 07/08/2020 10:23 AM    AMC 0.80 07/08/2020 10:23 AM    EOSA 4 07/08/2020 10:23 AM    AEC 0.20 07/08/2020 10:23 AM    BASA 1 07/08/2020 10:23 AM    ABC 0.10 07/08/2020 10:23 AM                  Assessment and Plan:  1. Microcytic Anemia-intermittent . Hgb 7.7 on 06/25/2020. Up  to 9 today. No over bleeding-although she is worried about bleeding on Xeralto. She will resume her oral iron and repeat CBC on 07/30/2020     2. Metastatic breast cancer history: The patient was originally diagnosed with right breast cancer in 1995, T2N1M0, 3/32 positive lymph nodes. Tumor was ER/PR positive, HER-2 Positive. She received chemotherapy with Adriamycin and Cytoxan for four cycles and then received five years of tamoxifen.  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 ?She had an intramedullary rod placed in the right femur and had radiation. She underwent 6 cycles of Taxotere and Herceptin, and was placed on Aredia and Arimidex.  Remains on Anastrazole (Arimidex). ?She has had no clinical recurrence, and has a normal tumor marker in March 2022. No signs of clinical recurrence. Follow up with Dr. Gerarda Fraction in June 2022.      3.  History of PE and DVT:  Currently on Xarelto after her CABG. Can return to coumadin?if she likes    4. CAD--s/p CABG and AVR  Afib with ablation. On Xeralto---will discuss switching back to coumadin with cardiology on appointment in April. If so, she can call our office and will send in prescription for coumadin and then monitor INR's as we did before.     5. Arm port in place: Flush at least every 3 months--she will be back on 07/30/2020 for labs and PAC flush.     Osteopenia on DEXA May 2020--Repeat in May 2022--ordered.     Patient verbalized understanding and agreement with the plan. Time spent including  face to face encounter, chart and diagnostic review, orders, documentation, and counseling and coordination of care was 35 minutes  ?

## 2020-07-11 ENCOUNTER — Encounter: Admit: 2020-07-11 | Discharge: 2020-07-11 | Payer: PRIVATE HEALTH INSURANCE

## 2020-07-11 DIAGNOSIS — D630 Anemia in neoplastic disease: Secondary | ICD-10-CM

## 2020-07-11 DIAGNOSIS — C50811 Malignant neoplasm of overlapping sites of right female breast: Secondary | ICD-10-CM

## 2020-07-25 ENCOUNTER — Encounter: Admit: 2020-07-25 | Discharge: 2020-07-25 | Payer: PRIVATE HEALTH INSURANCE

## 2020-07-25 DIAGNOSIS — Z7901 Long term (current) use of anticoagulants: Secondary | ICD-10-CM

## 2020-07-25 DIAGNOSIS — D649 Anemia, unspecified: Secondary | ICD-10-CM

## 2020-07-25 DIAGNOSIS — D630 Anemia in neoplastic disease: Secondary | ICD-10-CM

## 2020-07-25 DIAGNOSIS — C50811 Malignant neoplasm of overlapping sites of right female breast: Secondary | ICD-10-CM

## 2020-07-25 LAB — CBC AND DIFF
Lab: 0 K/UL (ref 0–0.20)
Lab: 0.2 K/UL (ref 0–0.45)
Lab: 0.6 K/UL (ref 0–0.80)
Lab: 0.6 K/UL — ABNORMAL LOW (ref 1.0–4.8)
Lab: 1 % (ref 0–2)
Lab: 10 % (ref 4–12)
Lab: 11 % — ABNORMAL LOW (ref 24–44)
Lab: 28 % — ABNORMAL LOW (ref 36–45)
Lab: 3.6 M/UL — ABNORMAL LOW (ref 4.0–5.0)
Lab: 4 % (ref 0–5)
Lab: 4.3 K/UL (ref 1.8–7.0)
Lab: 5.8 K/UL (ref 4.5–11.0)
Lab: 7.6 FL (ref 7–11)
Lab: 74 % (ref 41–77)
Lab: 78 FL — ABNORMAL LOW (ref 80–100)

## 2020-07-25 LAB — VITAMIN B12: Lab: 265 pg/mL — ABNORMAL LOW (ref >60)

## 2020-07-25 LAB — FOLATE, SERUM: Lab: 11 ng/mL — ABNORMAL LOW (ref >3.9)

## 2020-07-25 MED ORDER — HEPARIN, PORCINE (PF) 100 UNIT/ML IV SYRG
500 [IU] | Freq: Once | 0 refills | Status: CP
Start: 2020-07-25 — End: ?

## 2020-07-25 NOTE — Progress Notes
Pt here for nurse draw/port flush.  Port in arm accessed, positive blood return.  Specimen obtained for lab.  Port flushed and deaccessed.  Dismissed ambulatory, slow gait.

## 2020-07-25 NOTE — Patient Instructions
Call Immediately to report the following:  Uncontrolled nausea and/or vomiting, uncontrolled pain, or unusual bleeding.  Temperature of 100.4 F or greater and/or any sign/symptom of infection (redness, warmth, tenderness)  Painful mouth or difficulty swallowing  Red, cracked, or painful hands and/or feet  Diarrhea   Swelling of arms or legs  Rash    Port Instructions:  Ports must be flushed every 4 - 8 weeks when not in use.  Check with your MD, RN or scheduler.    Important Phone Numbers:  [facility] Main Number (answered 24 hours a day) [phone]      For up to date information on the COVID-19 virus, visit the CDC website. https://www.cdc.gov/coronavirus   General supportive care during cold and flu season and infection prevention reminders:    o Wash hands often with soap and water for at least 20 seconds   o Cover your mouth and nose   o Social distancing: try to maintain 6 feet between you and other people   o Stay home if sick and symptoms mild or manageable?   If you must be around people wear a mask     If you are having symptoms of a lower respiratory infection (cough, shortness of breath) and/or fever AND either traveled in last 30 days (internationally or to region of exposure) OR known exposure to patient with COVID19:     o Call your primary care provider for questions or health needs.    Tell your doctor about your recent travel and your symptoms     o In a medical emergency, call 911 or go to the nearest emergency room.

## 2020-08-20 ENCOUNTER — Encounter: Admit: 2020-08-20 | Discharge: 2020-08-20 | Payer: PRIVATE HEALTH INSURANCE

## 2020-08-20 NOTE — Telephone Encounter
Nira Conn, nurse with Dr. Graylon Good [facility]), called stating pt had iron studies there and wanted to discuss these.   This RN attempted to contact Nira Conn but left message with Network engineer. I explained they can just send [place] the results and provided our fax#. Explained we can review these with Dr. Celesta Aver and further discuss with pt as well. Provided my contact# if Nira Conn wants to call to further discuss as well.

## 2020-08-21 ENCOUNTER — Encounter: Admit: 2020-08-21 | Discharge: 2020-08-21 | Payer: PRIVATE HEALTH INSURANCE

## 2020-08-21 DIAGNOSIS — E611 Iron deficiency: Secondary | ICD-10-CM

## 2020-08-21 NOTE — Telephone Encounter
This RN received CBC and iron panel results from cardiology, see note from 4/27. Lab report to be scanned in to chart.   I reviewed these with Dr. Celesta Aver who states he would like pt to take oral iron QOD if not already doing so and can recheck in June when pt back for f/u with labs.   I spoke with pt to inform her of above. I inquired about oral iron and she states she is taking ferrous sulfate about 2x/week. She states it makes her constipated sometimes. I advised she try taking it QOD as recommended by Dr. Celesta Aver and ensure she takes a stool softener daily. Explained if constipation is still an issue she can take a stool softener BID. If she continues to have problems I advised she call us or inform Dr. Celesta Aver at appt in June.   I explained we would add on cbc and iron panel to labs to recheck in June. Pt agreed with plan and verbalized understanding. She was very appreciative, no further questions or concerns.

## 2020-10-17 ENCOUNTER — Encounter: Admit: 2020-10-17 | Discharge: 2020-10-17 | Payer: PRIVATE HEALTH INSURANCE

## 2020-10-17 DIAGNOSIS — M8589 Other specified disorders of bone density and structure, multiple sites: Secondary | ICD-10-CM

## 2020-10-22 ENCOUNTER — Encounter: Admit: 2020-10-22 | Discharge: 2020-10-22 | Payer: PRIVATE HEALTH INSURANCE

## 2020-10-24 ENCOUNTER — Encounter: Admit: 2020-10-24 | Discharge: 2020-10-24 | Payer: PRIVATE HEALTH INSURANCE

## 2020-10-24 DIAGNOSIS — I82409 Acute embolism and thrombosis of unspecified deep veins of unspecified lower extremity: Secondary | ICD-10-CM

## 2020-10-24 DIAGNOSIS — E669 Obesity, unspecified: Secondary | ICD-10-CM

## 2020-10-24 DIAGNOSIS — T148XXA Other injury of unspecified body region, initial encounter: Secondary | ICD-10-CM

## 2020-10-24 DIAGNOSIS — I359 Nonrheumatic aortic valve disorder, unspecified: Secondary | ICD-10-CM

## 2020-10-24 DIAGNOSIS — E611 Iron deficiency: Secondary | ICD-10-CM

## 2020-10-24 DIAGNOSIS — Z7901 Long term (current) use of anticoagulants: Secondary | ICD-10-CM

## 2020-10-24 DIAGNOSIS — C50811 Malignant neoplasm of overlapping sites of right female breast: Secondary | ICD-10-CM

## 2020-10-24 DIAGNOSIS — C50919 Malignant neoplasm of unspecified site of unspecified female breast: Secondary | ICD-10-CM

## 2020-10-24 DIAGNOSIS — I10 Essential (primary) hypertension: Secondary | ICD-10-CM

## 2020-10-24 DIAGNOSIS — Z853 Personal history of malignant neoplasm of breast: Secondary | ICD-10-CM

## 2020-10-24 DIAGNOSIS — Z95828 Presence of other vascular implants and grafts: Secondary | ICD-10-CM

## 2020-10-24 DIAGNOSIS — Z5181 Encounter for therapeutic drug level monitoring: Secondary | ICD-10-CM

## 2020-10-24 DIAGNOSIS — I351 Nonrheumatic aortic (valve) insufficiency: Secondary | ICD-10-CM

## 2020-10-24 LAB — CBC AND DIFF
MCHC: 32 g/dL (ref 32.0–36.0)
MPV: 8 FL (ref 7–11)
PLATELET COUNT: 169 K/UL (ref 150–400)
RBC COUNT: 4 M/UL (ref 4.0–5.0)
RDW: 17 % — ABNORMAL HIGH (ref 11–15)
WBC COUNT: 4.8 K/UL (ref 4.5–11.0)

## 2020-10-24 LAB — COMPREHENSIVE METABOLIC PANEL
POTASSIUM: 4.3 MMOL/L (ref 3.5–5.1)
SODIUM: 142 MMOL/L (ref 137–147)

## 2020-10-24 LAB — IRON + BINDING CAPACITY + %SAT+ FERRITIN
% SATURATION: 16 % — ABNORMAL LOW (ref 28–42)
FERRITIN: 76 ng/mL (ref 10–200)
IRON BINDING: 451 ug/dL — ABNORMAL HIGH (ref 270–380)
IRON: 74 ug/dL — ABNORMAL LOW (ref 50–160)

## 2020-10-24 MED ORDER — HEPARIN, PORCINE (PF) 100 UNIT/ML IV SYRG
500 [IU] | Freq: Once | 0 refills | Status: CP
Start: 2020-10-24 — End: ?

## 2020-10-24 MED ORDER — WARFARIN 2 MG PO TAB
2 mg | ORAL_TABLET | Freq: Every day | ORAL | 3 refills | 90.00000 days | Status: AC
Start: 2020-10-24 — End: ?

## 2020-10-24 NOTE — Progress Notes
Pt here for lab draw. No concerns voiced today. Port accessed, positive blood return noted. Labs obtained without difficulty. Port flushed with saline and heparin, then de-accessed.To see Dr. Celesta Aver next.

## 2020-10-24 NOTE — Progress Notes
Name: Sydney Mcconnell          MRN: [id number]      DOB: [birth year]      AGE: 70 y.o.   DATE OF SERVICE: 10/24/2020    Subjective:             Reason for Visit:  Breast cancer    Sydney Mcconnell is a 70 y.o. female.     Cancer Staging  Malignant neoplasm of overlapping sites of right female breast [facility])  Staging form: Breast, AJCC 6th Edition  - Clinical: No stage assigned - Unsigned  - Pathologic: Stage IIB (T2, N1, MX) - Unsigned      History of Present Illness    CANCER HISTORY: The patient was originally diagnosed with right breast cancer in 1995, T2N1M0, 3/32 positive lymph nodes. Tumor was ER/PR positive, HER-2 Positive. She received chemotherapy with Adriamycin and Cytoxan for four cycles and then received five years of tamoxifen.  ?  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  She had an intramedullary rod placed in the right femur and had radiation. She underwent 6 cycles of Taxotere and Herceptin, and was placed on Aredia and Arimidex. The Aredia was eventually switched to Zometa.   ?  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 She underwent debridement, 20 hyperbaric oxygen therapies, and was dramatically improved. The process essentially arrested in 2008.   ?  In 2010 she had the right femur rod replaced as it was bending. Intramedullary rod replaced again in Jan 2011 when she tripped and was found to have a linear nondisplaced fracture of the lateral cortex of the proximal R diaphysis corresponding to the focus of increased enhancement on the bone scan.  ?  March 30, 2012 she had a fall and broke left femur. A rod was placed there. She had a long period of PT.   ?  She had done well until May 2014 when she felt she pulled a muscle or broke a bone in her rib or scapula and had been in extreme pain not relieved by NSAIDS. Bone scan 09/26/12 showed an area of increased uptake in left posterior rib in area of discomfort. Compatible with small rib fracture (patient carrying a box when it occurred ). Plain rib films negative for fracture or mass. This pain was in the right back, under the scapula. Tumor markers did not rise.  ?  Due to delayed union of a left subtrochanteric femur fracture, she met back with Dr Cherene Julian in Orthopedic surgery. She had dynamization (removal) of the left trochanteric femoral nail on 01/01/13.     She had follow-up with Dr Cherene Julian May 2015. X-rays show significant healing since 12/2012 x-rays.   ?  Ongoing back pain after a snap sound in early July 2104:    CT chest done 11/02/12:  There are no pathologically enlarged mediastinal or hilar lymph nodes, though limited evaluation in the absence of IV contrast.   There is no new or enlarging pulmonary nodule.  There are no aggressive osseous lesions or displaced rib fracture. An old right posterior seventh rib fracture is seen. There is exaggeration of the normal thoracic kyphosis. Prior right mastectomy and flap reconstruction is again noted without axillary lymphadenopathy.  ?  Mammograms 2015 to 2018 on record   ?  Had an ER visit in July 2018 with an episode of right  jaw numbness lasting 6 hours - CT head was ok.   ?  Mammogram 09/03/17:  There are scattered areas of fibroglandular density. ?2D, and 3D   images were obtained. No masses, densities or calcifications to   suggest malignancy. No change when compared to prior studies.  ?  ?  ?  ?  Past Medical History: Episodic tachycardia; follows with cardiology.     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   ?  Family History: Mother died with CHF age 47. Sister with negative BRCA testing, but died in 01-28-2015with breast and colon cancer  ?  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   ?  ?  ?  ?  ?  INTERVAL HISTORY:  ?  She has a metastatic breast cancer history, but no recent bone progression.   ?  Here for a 6 month follow up.   ?  She has had CABG in April 2021 at [facility] in [place].   HGB was down to 7-8 post op.     We looked at HGB and iron panel in April 2022 - I asked her to take iron QOD.   HGB was still only at 8.9 then.        Ref. Range 07/25/2020 09:27   Serum Folate Latest Ref Range: >3.9 NG/ML 11.1   Vitamin B12   Latest Ref Range: 180 - 914 PG/ML 265     Hemoglobin is much  better     Ref. Range 07/25/2020 09:27 10/16/2020 00:00 10/24/2020 10:14   Hemoglobin Latest Ref Range: 12.0 - 15.0 GM/DL 8.9 (L)  16.1 (L)   Hematocrit Latest Ref Range: 36 - 45 % 28.8 (L)  36.6   Platelet Count Latest Ref Range: 150 - 400 K/UL 223  169   White Blood Cells Latest Ref Range: 4.5 - 11.0 K/UL 5.8  4.8   Neutrophils Latest Ref Range: 41 - 77 % 74  66   Absolute Neutrophil Count Latest Ref Range: 1.8 - 7.0 K/UL 4.30  3.20   Lymphocytes Latest Ref Range: 24 - 44 % 11 (L)  17 (L)       She feels a lot better, breathing is better.   She discovered she was in A fib for several months. Ablation tried in October 2021- out of A fib now.   Remains of diltiazem.   ?  History of DVT. No new leg pains.    She would prefer coumadin; has been on Xarelto.   ?  Continues on Arimidex.    Iron pills cause a bit of constipation.   ?  Tumor marker was normal March 2022  ?  Still having lymphedema of right arm, chronic, unchanged. Using compression sleeves and more helpful. Has a port in left arm. Functioning well when she gets blood draws.   ?  No pain meds needed now. No fentanyl patches.   Back and leg much improved with PT  ?  Fatigue is better.   ?  Vision is poor at baseline. She will get some vertigo.   ?  Always sleeps sitting up - this is a chronic issue for years. Rare edema of legs.   ?  Mammogram March 2021: Normal    ?       Review of Systems      No headaches or vision changes  No swallowing problems  No new lymph  nodes enlarged  No bruising or petechiae  No breathing difficulty  The patient denies chest pain or pleurisy  No abdominal pain  No change in bowel habits or gastrointestinal bleeding  No hematuria  There has been no change in arthralgias  No new skin rashes  No leg edema noted  Objective:         ? amiodarone (CORDARONE) 200 mg tablet    ? anastrozole (ARIMIDEX) 1 mg tablet TAKE 1 TABLET BY MOUTH EVERY DAY   ? aspirin 81 mg chewable tablet Chew 81 mg by mouth daily. Take with food.   ? atorvastatin (LIPITOR) 20 mg tablet    ? Calcium Carbonate 600 mg (1,500 mg) tab Take 2 Tabs by mouth daily.   ? CARVEDILOL (COREG PO) Take  by mouth. Indications: 1/2 po bid   ? cholecalciferol (VITAMIN D-3) 400 unit tab Take 400 Units by mouth twice daily.   ? warfarin (COUMADIN) 2 mg tablet TAKE 1 TABLET BY MOUTH 6 TIMES WEEKLY   ? XARELTO 20 mg tablet      There were no vitals filed for this visit.  There is no height or weight on file to calculate BMI.                  Pain Addressed:  N/A    Patient Evaluated for a Clinical Trial: No treatment clinical trial available for this patient.     [place] Cooperative Oncology Group performance status is 0, Fully active, able to carry on all pre-disease performance without restriction.Marland Kitchen     Physical Exam       In general, the patient is alert and pleasant  No scleral icterus  No neck lymphadenopathy or supraclavicular lymphadenopathy  No oral thrush   Lungs are clear without wheezing   HR about 80, regular  Extremities show no edema or tenderness  Abd not distended  No rash or petechiae  Normal gait; normal cranial nerves           Assessment and Plan:    1. History of PE and DVT:   ?  Currently on Xarelto after her CABG   ?  Can return to coumadin if she likes - she would like to make the switch    Will restart at 2 mg 6 days a week as she has done before.     INRs here  ?  ?  2. Metastatic breast cancer history: The patient was originally diagnosed with right breast cancer in 1995, T2N1M0, 3/32 positive lymph nodes. Tumor was ER/PR positive, HER-2 Positive. She received chemotherapy with Adriamycin and Cytoxan for four cycles and then received five years of tamoxifen.  ?  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  She had an intramedullary rod placed in the right femur and had radiation. She underwent 6 cycles of Taxotere and Herceptin, and was placed on Aredia and Arimidex.   ?  Remains on Anastrazole (Arimidex).  She has had no clinical recurrence, and has a normal tumor marker in Nov 2020  ?  No signs of clinical recurrence.   ?  Tumor marker normal, and pending from today     Ref. Range 06/25/2020 09:36   CA27.29 Latest Units: U/mL 16.6       ?  3. Slight stable murmur; no signs of CHF  ?Not in A fib.   Had an ablation      4. CAD: sp CABG.   ?  Cardiac rehab  now stopped  She is riding her exercise bike though  ?  ?  5. Back pain:  Was briefly on fentanyl patches, then stopped  PT has helped her back   Now using the exercise bike quite a lot      6. Arm port in place: Flush at least every 3 months, I have asked her to do this more often.   Flushed today  ?    7. Anemia post op - was worse, and now improving.      With such good improvement can stop oral iron.   ?    8. Osteopenia on DEXA May 2020  ?  ?  ?

## 2020-10-24 NOTE — Patient Instructions
Central Line (Central Venous Access Device)  You need a central line as part of your treatment. It?s also called a central venous access device (CVAD) or central venous catheter (CVC). A small, soft tube called a catheter is put in a vein that leads to your heart. When you no longer need the central line, it will be taken out. Your skin will then heal. This sheet describes types of central lines. It also explains how the central line is placed in your body.      The catheter may have more than 1 channel. This means that different fluids or medicines can be given at the same time.     What a central line does  A central line is often used instead of a standard IV (intravenous) line when you need treatment for longer than a week or so. The line can deliver medicine, fluids, or nutrition right into your bloodstream. It can also be used to measure blood flow (hemodynamic monitoring), to draw blood, or for other reasons. Ask your healthcare provider why you need the central line and which type you?ll get.   Types of central lines  The central line will be placed into one of the veins as described below. Which vein is used depends on your needs and overall health. The catheter is threaded through the vein. It is passed along until the tip sits in the large vein near the heart (vena cava). Types of central lines include:   ? Peripherally inserted central catheter (PICC). This line is placed in a large vein in the upper arm, or near the bend of the elbow.  ? Subclavian line. This line is placed into the vein that runs behind the collarbone.  ? Internal jugular line. This line is placed into a large vein in the neck.  ? Femoral line. This line is placed in a large vein in the groin.  Placing the central line  The central line is placed in your body during a short procedure. This may be done in your hospital room, the emergency department, or an operating room. Your healthcare team can tell you what to expect. During central line placement:   ? You?re fully covered with a large sterile sheet. Only the spot where the line will be placed is exposed. The skin is cleaned with antiseptic solution. These steps?lower the risk for infection.  ? Medicine (local anesthetic) is injected near the vein. This numbs the skin so you don?t feel pain during the procedure.  ? After the pain medicine starts to work, the catheter is gently passed into the vein. It?s moved forward until the tip of the catheter is in the vena cava, close to the heart. This is usually done with the help of an ultrasound machine. The ultrasound machine helps see below the skin. It helps the provider guide the catheter into the vein without hurting other tissues or organs.  ? The other end of the catheter extends a few inches out from your skin. It may be loosely attached to the skin with?stitches to hold it in place.  ? The healthcare provider flushes the catheter with saline solution to clear it. The solution may include heparin. This prevents blood clots.  ? An X-ray or other imaging test is done. This allows the provider?to confirm the catheter?s position and check for problems.  Risks and possible complications  As with any procedure, having a central line placed has certain risks. These include:   ? Infection  ?  Bleeding problems  ? An irregular heartbeat  ? Injury to the vein or to lymph ducts near the vein  ? Inflammation of the vein (phlebitis)  ? Air bubble in the blood (air embolism).?An air embolism can travel through the blood vessels and block blood flow to the heart, lungs, brain, or other organs.  ? Blood clot (thrombus) that can block the flow of blood.?A blood clot can also travel through the blood vessels. It can block blood flow to the heart, lungs (pulmonary embolism), brain, or other organs.  ? Collapsed lung (pneumothorax) or blood buildup between the lungs and the chest wall (hemothorax)  ? Nerve injury  ? Accidental insertion into an artery instead of a vein  ? Catheter not positioned correctly  If you have any problems with your central line, talk with your healthcare provider.   [employer] last reviewed this educational content on 05/27/2020    ? 2000-2022 [employer], [place]. All rights reserved. This information is not intended as a substitute for professional medical care. Always follow your healthcare professional's instructions.

## 2021-04-09 ENCOUNTER — Encounter: Admit: 2021-04-09 | Discharge: 2021-04-09 | Payer: PRIVATE HEALTH INSURANCE

## 2021-04-09 DIAGNOSIS — E669 Obesity, unspecified: Secondary | ICD-10-CM

## 2021-04-09 DIAGNOSIS — I10 Essential (primary) hypertension: Secondary | ICD-10-CM

## 2021-04-09 DIAGNOSIS — T148XXA Other injury of unspecified body region, initial encounter: Secondary | ICD-10-CM

## 2021-04-09 DIAGNOSIS — Z853 Personal history of malignant neoplasm of breast: Secondary | ICD-10-CM

## 2021-04-09 DIAGNOSIS — I82409 Acute embolism and thrombosis of unspecified deep veins of unspecified lower extremity: Secondary | ICD-10-CM

## 2021-04-09 DIAGNOSIS — Z7901 Long term (current) use of anticoagulants: Secondary | ICD-10-CM

## 2021-04-09 DIAGNOSIS — I351 Nonrheumatic aortic (valve) insufficiency: Secondary | ICD-10-CM

## 2021-04-09 DIAGNOSIS — C50811 Malignant neoplasm of overlapping sites of right female breast: Secondary | ICD-10-CM

## 2021-04-09 DIAGNOSIS — Z5181 Encounter for therapeutic drug level monitoring: Secondary | ICD-10-CM

## 2021-04-09 DIAGNOSIS — I359 Nonrheumatic aortic valve disorder, unspecified: Secondary | ICD-10-CM

## 2021-04-09 DIAGNOSIS — C50919 Malignant neoplasm of unspecified site of unspecified female breast: Secondary | ICD-10-CM

## 2021-04-09 DIAGNOSIS — E611 Iron deficiency: Secondary | ICD-10-CM

## 2021-04-09 DIAGNOSIS — Z95828 Presence of other vascular implants and grafts: Secondary | ICD-10-CM

## 2021-04-09 LAB — PROTIME INR (PT): INR POC: 6.3 — ABNORMAL HIGH (ref 0.8–1.2)

## 2021-04-09 MED ORDER — HEPARIN, PORCINE (PF) 100 UNIT/ML IV SYRG
500 [IU] | Freq: Once | 0 refills | Status: AC
Start: 2021-04-09 — End: ?

## 2021-04-09 NOTE — Progress Notes
Name: Sydney Mcconnell          MRN: [id number]      DOB: 25-Feb-1951      AGE: 70 y.o.   DATE OF SERVICE: 04/09/2021    Subjective:             Reason for Visit:  Breast cancer    Sydney Mcconnell is a 70 y.o. female.     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 Tumor was ER/PR positive, HER-2 Positive. She received chemotherapy with Adriamycin and Cytoxan for four cycles and then received five years of tamoxifen.  ?  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  She had an intramedullary rod placed in the right femur and had radiation. She underwent 6 cycles of Taxotere and Herceptin, and was placed on Aredia and Arimidex. The Aredia was eventually switched to Zometa.   ?  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 She underwent debridement, 20 hyperbaric oxygen therapies, and was dramatically improved. The process essentially arrested in 2008.   ?  In 2010 she had the right femur rod replaced as it was bending. Intramedullary rod replaced again in Jan 2011 when she tripped and was found to have a linear nondisplaced fracture of the lateral cortex of the proximal R diaphysis corresponding to the focus of increased enhancement on the bone scan.  ?  March 30, 2012 she had a fall and broke left femur. A rod was placed there. She had a long period of PT.   ?  She did well until May 2014 when she felt she pulled a muscle or broke a bone in her rib or scapula and had been in extreme pain not relieved by NSAIDS.     Bone scan 09/26/12 showed an area of increased uptake in left posterior rib in area of discomfort. Compatible with small rib fracture (patient carrying a box when it occurred ). Plain rib films negative for fracture or mass. Tumor markers did not rise.  ?  Due to delayed union of a left subtrochanteric femur fracture, she met back with Dr Cherene Julian in Orthopedic surgery. She had dynamization (removal) of the left trochanteric femoral nail on 01/01/13.     She had follow-up with Dr Cherene Julian May 2015. X-rays show significant healing since 12/2012 x-rays.   ?  Back pain after a snap sound in early July 2104:    CT chest done 11/02/12:  There are no pathologically enlarged mediastinal or hilar lymph nodes, though limited evaluation in the absence of IV contrast.   There is no new or enlarging pulmonary nodule.  There are no aggressive osseous lesions or displaced rib fracture. An old right posterior seventh rib fracture is seen. There is exaggeration of the normal thoracic kyphosis. Prior right mastectomy and flap reconstruction is again noted without axillary lymphadenopathy.  ?    Had an ER visit in July 2018 with an episode of right jaw numbness lasting 6 hours - CT head was ok.     ?  ?    ?  Past Medical History: Episodic tachycardia; follows with cardiology.     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   ?  Family History: Mother died with CHF age 39. Sister with negative BRCA testing, but died in January 24, 2015with breast and colon cancer  ?  Social: Worked as a Scientist, clinical (histocompatibility and immunogenetics); Retired  ?  ?  ?  ?  ?  INTERVAL HISTORY:  ?  She has a metastatic breast cancer history, but no recent bone progression.   ?  Here for a 6 month follow up.   ?  She has had CABG in April 2021 at [facility] in [place].     HGB was down to 7-8 post op.     We looked at HGB and iron panel in April 2022 - I asked her to take iron QOD. HGB was still only at 8.9 then. April 2022 B12 and folate were normal.     Hemoglobin improved to 11.9 at last visit.     She is feeling very well after a Atrial Fib ablation.     She asked to return to coumadin 10/24/20  2 mg for 6 days a week restarted July 2022 - I thought - it turns out she finished all her Earlyne Iba in October and then started her coumadin.    She was supposed to get INRs here.  I haven't heard from or seen her since July 2022.         She discovered she was in A fib for several months. Ablation tried in October 2021- out of A fib now.   Remains of diltiazem.   ?  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     Continues on Arimidex.    Iron pills cause a bit of constipation.   ?  Tumor marker was normal July 2022     Latest Reference Range & Units 10/24/20 10:14   CA27.29 U/mL 19.8     Chronic lymphedema of right arm, chronic, unchanged. Using compression sleeves and more helpful. Has a port in left arm. Functioning well when she gets blood draws.   ?  No pain meds needed now. No fentanyl patches.   Back and leg much improved with PT  ?  Fatigue is better.   ?  Vision is poor at baseline. She will get some vertigo.   ?  Always sleeps sitting up - this is a chronic issue for years. Rare edema of legs.   ?  Mammogram March 2021: Normal    ?       Review of Systems      No headaches or vision changes  No swallowing problems  No new lymph nodes enlarged  No bruising or petechiae  No breathing difficulty  The patient denies chest pain or pleurisy  No abdominal pain  No change in bowel habits or gastrointestinal bleeding  No hematuria  There has been no change in arthralgias  No new skin rashes  No leg edema noted  Objective:         ? anastrozole (ARIMIDEX) 1 mg tablet TAKE 1 TABLET BY MOUTH EVERY DAY   ? aspirin 81 mg chewable tablet Chew 81 mg by mouth daily. Take with food.   ? atorvastatin (LIPITOR) 20 mg tablet    ? Calcium Carbonate 600 mg (1,500 mg) tab Take 2 Tabs by mouth daily.   ? CARVEDILOL (COREG PO) Take  by mouth.  Indications: 1/2 po bid   ? cholecalciferol (VITAMIN D-3) 400 unit tab Take 400 Units by mouth twice daily.     There were no vitals filed for this visit.  There is no height or weight on file to calculate BMI.                  Pain Addressed:  N/A    Patient Evaluated for a Clinical Trial: No treatment clinical trial available for this patient.     [place] Cooperative Oncology Group performance status is 0, Fully active, able to carry on all pre-disease performance without restriction.Marland Kitchen     Physical Exam       In general, the patient is alert and pleasant  No scleral icterus  No neck lymphadenopathy or supraclavicular lymphadenopathy  No oral thrush   Lungs are clear without wheezing   Extremities show no edema or tenderness  Abdomen not distended  No rash or petechiae  Normal gait; normal cranial nerves           Assessment and Plan:    1. History of PE and DVT:   ?  Was on Xarelto after her CABG   ?  She asked to return to coumadin 10/24/20    2 mg 6 days a week restarted July 2022 - I thought.     She was supposed to get INRs here.  I haven't heard from or seen her since July 2022.   - it turns out she finished all her Earlyne Iba in October and then started her coumadin.    ?  2. Metastatic breast cancer history: The patient was originally diagnosed with right breast cancer in 1995, T2N1M0, 3/32 positive lymph nodes. Tumor was ER/PR positive, HER-2 Positive. She received chemotherapy with Adriamycin and Cytoxan for four cycles and then received five years of tamoxifen.  ?  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  She had an intramedullary rod placed in the right femur and had radiation. She underwent 6 cycles of Taxotere and Herceptin, and was placed on Aredia and Arimidex.   ?  Remains on Anastrazole (Arimidex).  She has had no clinical recurrence, and has a normal tumor marker in Nov 2020  ?  No signs of clinical recurrence.   ?  Tumor marker normal, and pending from today     Ref. Range 06/25/2020 09:36   CA27.29 Latest Units: U/mL 16.6       ?  3. Slight stable murmur; no signs of CHF  ?Not in A fib.     Had an ablation      4. CAD: sp CABG.   ?  Cardiac rehab now stopped  She is riding her exercise bike though  ?  ?  5. Back pain:  Was briefly on fentanyl patches, then stopped  PT has helped her back   Now using the exercise bike quite a lot      6. Arm port in place: Flush at least every 3 months, I have asked her to do this more often.   Flushed today  ?    7. Anemia post op - was worse, and now improving.      With such good improvement can stop oral iron.   ?    8. Osteopenia on DEXA May 2020  ?  ?    Labs still pending today    RTC in 6 months  ?

## 2021-04-09 NOTE — Telephone Encounter
Received critical results of INR 6.3. Discussed with Dr. Celesta Aver who states pt has been on Coumadin 2mg  x6d. He would like pt to hold Coumadin for 3 days and then start 2mg  every other day. INR to be checked in one month.  I spoke with pt to inform her of above. She repeated instructions, agreed with plan, and verbalized understanding. Explained scheduling will contact for lab appt in one month. No further questions or concerns.

## 2021-04-09 NOTE — Patient Instructions
For up to date information on the COVID-19 virus, visit the CDC website. https://www.cdc.gov/coronavirus   General supportive care during cold and flu season and infection prevention reminders:    o Wash hands often with soap and water for at least 20 seconds   o Cover your mouth and nose   o Social distancing: try to maintain 6 feet between you and other people   o Stay home if sick and symptoms mild or manageable?  If you must be around people wear a mask     If you are having symptoms of a lower respiratory infection (cough, shortness of breath) and/or fever AND either traveled in last 30 days (internationally or to region of exposure) OR known exposure to patient with COVID19:     o Call your primary care provider for questions or health needs.   Tell your doctor about your recent travel and your symptoms     o In a medical emergency, call 911 or go to the nearest emergency room.

## 2021-05-08 ENCOUNTER — Encounter: Admit: 2021-05-08 | Discharge: 2021-05-08 | Payer: PRIVATE HEALTH INSURANCE

## 2021-05-08 DIAGNOSIS — C50811 Malignant neoplasm of overlapping sites of right female breast: Secondary | ICD-10-CM

## 2021-05-08 LAB — CBC AND DIFF
ABSOLUTE BASO COUNT: 0.1 K/UL (ref 0–0.20)
ABSOLUTE EOS COUNT: 0.2 K/UL (ref 0–0.45)
ABSOLUTE LYMPH COUNT: 0.9 K/UL — ABNORMAL LOW (ref 1.0–4.8)
ABSOLUTE MONO COUNT: 0.6 K/UL (ref 0–0.80)
ABSOLUTE NEUTROPHIL: 4 K/UL (ref 1.8–7.0)
BASOPHILS %: 1 % (ref 0–2)
EOSINOPHILS %: 3 % (ref >60)
HEMATOCRIT: 39 % (ref 36–45)
HEMOGLOBIN: 12 g/dL (ref 12.0–15.0)
LYMPHOCYTES %: 16 % — ABNORMAL LOW (ref 24–44)
MCH: 29 pg (ref 26–34)
MCHC: 32 g/dL (ref 32.0–36.0)
MCV: 90 FL (ref 80–100)
MONOCYTES %: 11 % (ref 4–12)
MPV: 7.8 FL (ref 7–11)
PLATELET COUNT: 220 K/UL (ref 150–400)
RBC COUNT: 4.3 M/UL (ref 4.0–5.0)
RDW: 14 % (ref 11–15)
WBC COUNT: 5.8 K/UL (ref 4.5–11.0)

## 2021-05-08 LAB — COMPREHENSIVE METABOLIC PANEL
CO2: 26 MMOL/L (ref 21–30)
POTASSIUM: 4.1 MMOL/L (ref 3.5–5.1)
SODIUM: 140 MMOL/L (ref 137–147)

## 2021-05-08 LAB — PROTIME INR (PT): INR POC: 1.9 — ABNORMAL HIGH (ref 0.8–1.2)

## 2021-05-08 MED ORDER — HEPARIN, PORCINE (PF) 100 UNIT/ML IV SYRG
500 [IU] | Freq: Once | 0 refills | Status: CP
Start: 2021-05-08 — End: ?

## 2021-05-08 NOTE — Progress Notes
Patient denies complaints and any new symptoms.  Port accessed with 20 G 3/4 inch needle using sterile technique.  Positive blood return and flushed without difficulty.  Labs collected.  Port flushed with 20 ml NS and 500 units heparin.  Needle removed and patient discharged in good condition.

## 2021-05-08 NOTE — Patient Instructions
IV LINES  Intravenous (IV) lines are thin, flexible, plastic hoses that run from a bottle or bag of medicine into a tiny needle or intravenous catheter (a small flexible tube) placed in a vein in your body. Some patients may have a port (like a small drum) permanently placed in the chest or arm. Special needles are then put into the port. Some patients may have long-term tubes (catheters) that require no needles. Some medicines are injected into the catheter. Other medicines and fluids are given slowly (infused). The speed (rate) of the infusion is set by a roller clamp on the tube, by a balloon that squeezes out the medicine, or by an electronic pump.   What the patient can do   IVs:   For permanent IV sites (e.g., Hickman?, Port-a-Cath?, PasPort?, Infusaport?):   Keep extra clamps handy at all times.    If a tube breaks and you notice blood leaking out, clamp the tube between your body and the leak and call your doctor right away.   Shower facing away from the shower head. If you have an electric pump, unplug it before showering or bathing, to avoid an electrical hazard. Try to keep the dressing dry, but change it if it gets wet.    Watch for redness, swelling, pain, and tenderness at the site.   Use a calendar to record when you change injection caps and dressings.   Keep the IV site clean and dry.   Avoid the temptation to speed up your IV medicines or fluids. Many IV medicines and fluids can harm you if they go in too fast.   Wash your hands well with soap and water before touching the IV site.   Check the IV site daily:   Look for any tenderness, pain, redness, burning, swelling, warmth; any slowing of flow rate of IV; or drainage (bloody, yellowish, or clear) that might mean it is getting infected or clogged.    Be sure tape is holding the IV in place and the dressing is clean and dry.    Every day check for a temperature of more than 100.5?F taken by mouth.    If you notice any of the above symptoms, take off the dressing , look at the IV site and tell your doctor what you found   If the IV comes out or site begins to bleed, call your doctor or nurse right away.   Avoid activities that may pull out the IV or rub on the dressing.   Keep a daily log of procedures performed.   Call the doctor if the patient:   has redness, swelling, drainage, pain, tenderness, or warmth at an IV site, or at the site of a permanent IV access device   has a temperature is 100.5? F or higher when taken by mouth   has bleeding from the IV or access site   cannot flush or use their catheter or tube   becomes confused, disoriented, or unusually drowsy   becomes more short of breath   develops a cough  American [employer], Inc./ www.cancer.org    [facility] guidelines for flushing IV lines and dressing change frequency:  Implanted Port- Flush at least once every 4-6 weeks. Change port needle and dressing every 7 days  PICCs, Groshongs, Hickmans, Midlines- Flush usually once daily for each lumen of IV line.  Change dressing every 7 days and as needed.   If your doctor gives you other guidelines to follow, please follow  the instructions that they give you.

## 2021-06-20 ENCOUNTER — Encounter: Admit: 2021-06-20 | Discharge: 2021-06-20 | Payer: PRIVATE HEALTH INSURANCE

## 2021-06-20 MED ORDER — ANASTROZOLE 1 MG PO TAB
ORAL_TABLET | 3 refills
Start: 2021-06-20 — End: ?

## 2021-07-10 ENCOUNTER — Encounter: Admit: 2021-07-10 | Discharge: 2021-07-10 | Payer: PRIVATE HEALTH INSURANCE

## 2021-07-10 DIAGNOSIS — C50811 Malignant neoplasm of overlapping sites of right female breast: Secondary | ICD-10-CM

## 2021-07-10 NOTE — Progress Notes
Pt here today for labs and port flush. INR lab drawn in lab. Port accessed with multiple attempts, unable to access. Pt to come back in two weeks to be re-accessed. Pt discharged home in stable condition.

## 2021-08-03 ENCOUNTER — Encounter: Admit: 2021-08-03 | Discharge: 2021-08-03 | Payer: PRIVATE HEALTH INSURANCE

## 2021-08-03 DIAGNOSIS — C50811 Malignant neoplasm of overlapping sites of right female breast: Secondary | ICD-10-CM

## 2021-08-03 LAB — PROTIME INR (PT): INR POC: 1 U/L — ABNORMAL HIGH (ref 0.8–1.2)

## 2021-08-03 NOTE — Progress Notes
Patient here to check INR today. Labs drawn from port by Judson Roch, RN. INR  at 1.0 today. Notified Janett Billow, RN who is working with Dr. Celesta Aver today, states she will follow up with  Dr. Celesta Aver and NP to review the INR level.

## 2021-08-03 NOTE — Progress Notes
Attempted to call pt about INR 2 times.  Phone does not ring.  Will try again.

## 2021-08-03 NOTE — Patient Instructions
IV LINES  Intravenous (IV) lines are thin, flexible, plastic hoses that run from a bottle or bag of medicine into a tiny needle or intravenous catheter (a small flexible tube) placed in a vein in your body. Some patients may have a port (like a small drum) permanently placed in the chest or arm. Special needles are then put into the port. Some patients may have long-term tubes (catheters) that require no needles. Some medicines are injected into the catheter. Other medicines and fluids are given slowly (infused). The speed (rate) of the infusion is set by a roller clamp on the tube, by a balloon that squeezes out the medicine, or by an electronic pump.   What the patient can do   IVs:   For permanent IV sites (e.g., Hickman?, Port-a-Cath?, PasPort?, Infusaport?):   Keep extra clamps handy at all times.    If a tube breaks and you notice blood leaking out, clamp the tube between your body and the leak and call your doctor right away.   Shower facing away from the shower head. If you have an electric pump, unplug it before showering or bathing, to avoid an electrical hazard. Try to keep the dressing dry, but change it if it gets wet.    Watch for redness, swelling, pain, and tenderness at the site.   Use a calendar to record when you change injection caps and dressings.   Keep the IV site clean and dry.   Avoid the temptation to speed up your IV medicines or fluids. Many IV medicines and fluids can harm you if they go in too fast.   Wash your hands well with soap and water before touching the IV site.   Check the IV site daily:   Look for any tenderness, pain, redness, burning, swelling, warmth; any slowing of flow rate of IV; or drainage (bloody, yellowish, or clear) that might mean it is getting infected or clogged.    Be sure tape is holding the IV in place and the dressing is clean and dry.    Every day check for a temperature of more than 100.5?F taken by mouth.    If you notice any of the above symptoms, take off the dressing , look at the IV site and tell your doctor what you found   If the IV comes out or site begins to bleed, call your doctor or nurse right away.   Avoid activities that may pull out the IV or rub on the dressing.   Keep a daily log of procedures performed.   Call the doctor if the patient:   has redness, swelling, drainage, pain, tenderness, or warmth at an IV site, or at the site of a permanent IV access device   has a temperature is 100.5? F or higher when taken by mouth   has bleeding from the IV or access site   cannot flush or use their catheter or tube   becomes confused, disoriented, or unusually drowsy   becomes more short of breath   develops a cough  American [employer], Inc./ www.cancer.org    [facility] guidelines for flushing IV lines and dressing change frequency:  Implanted Port- Flush at least once every 4-6 weeks. Change port needle and dressing every 7 days  PICCs, Groshongs, Hickmans, Midlines- Flush usually once daily for each lumen of IV line.  Change dressing every 7 days and as needed.   If your doctor gives you other guidelines to follow, please follow  the instructions that they give you.

## 2021-08-03 NOTE — Progress Notes
Patient here for Port flush and INR draw.  Port accessed with 20 G 3/4 inch needing using sterile technique.  Positive blood return and flushed without difficulty.  Needle removed and patient discharged in good condition.

## 2021-09-14 ENCOUNTER — Encounter: Admit: 2021-09-14 | Discharge: 2021-09-14 | Payer: PRIVATE HEALTH INSURANCE

## 2021-09-17 ENCOUNTER — Encounter: Admit: 2021-09-17 | Discharge: 2021-09-17 | Payer: PRIVATE HEALTH INSURANCE

## 2021-09-17 DIAGNOSIS — E669 Obesity, unspecified: Secondary | ICD-10-CM

## 2021-09-17 DIAGNOSIS — Z7901 Long term (current) use of anticoagulants: Secondary | ICD-10-CM

## 2021-09-17 DIAGNOSIS — I351 Nonrheumatic aortic (valve) insufficiency: Secondary | ICD-10-CM

## 2021-09-17 DIAGNOSIS — I10 Essential (primary) hypertension: Secondary | ICD-10-CM

## 2021-09-17 DIAGNOSIS — I82409 Acute embolism and thrombosis of unspecified deep veins of unspecified lower extremity: Secondary | ICD-10-CM

## 2021-09-17 DIAGNOSIS — R635 Abnormal weight gain: Secondary | ICD-10-CM

## 2021-09-17 DIAGNOSIS — C50811 Malignant neoplasm of overlapping sites of right female breast: Secondary | ICD-10-CM

## 2021-09-17 DIAGNOSIS — E611 Iron deficiency: Secondary | ICD-10-CM

## 2021-09-17 DIAGNOSIS — C50919 Malignant neoplasm of unspecified site of unspecified female breast: Secondary | ICD-10-CM

## 2021-09-17 DIAGNOSIS — Z5181 Encounter for therapeutic drug level monitoring: Secondary | ICD-10-CM

## 2021-09-17 DIAGNOSIS — Z95828 Presence of other vascular implants and grafts: Secondary | ICD-10-CM

## 2021-09-17 DIAGNOSIS — T148XXA Other injury of unspecified body region, initial encounter: Secondary | ICD-10-CM

## 2021-09-17 LAB — COMPREHENSIVE METABOLIC PANEL
BLD UREA NITROGEN: 20 mg/dL (ref 7–25)
CHLORIDE: 106 MMOL/L (ref 98–110)
CREATININE: 0.6 mg/dL (ref 0.4–1.00)
GLUCOSE,PANEL: 88 mg/dL (ref 70–100)
POTASSIUM: 4 MMOL/L (ref 3.5–5.1)
SODIUM: 140 MMOL/L (ref 137–147)

## 2021-09-17 LAB — TSH WITH FREE T4 REFLEX: TSH: 3.6 uU/mL (ref 0.35–5.00)

## 2021-09-17 LAB — CBC AND DIFF
RBC COUNT: 4.2 M/UL (ref 4.0–5.0)
WBC COUNT: 6.1 K/UL (ref 4.5–11.0)

## 2021-09-17 LAB — PROTIME INR (PT): INR POC: 4 — ABNORMAL HIGH (ref 0.8–1.2)

## 2021-09-17 NOTE — Patient Instructions
.  Sydney Mcconnell  Discharge Instructions      Sydney Mcconnell  09/17/2021    Treatment Received Today: Port flushed            Discharge Instructions  Call immediately to report the following:  Uncontrolled nausea or vomiting, pain, or bleeding  Temperature of 100.5 F or greater or any sign/symptom of infection (warmth, redness, tenderness)  Painful mouth or difficulty swallowing  Diarrhea   Swelling of arms or legs  Rash     Post-Treatment Directions:  Use mouth rinses after meals and at bedtime.  Use a non-alcohol commercial brand rinse   or mild salt water/baking soda rinse.    Drink 8-10 glasses of fluids daily.  Try to exercise daily to decrease fatigue.      Medication Instructions  If there are any specific medication instructions they are written below          Phone Numbers  [place] Phone # [phone]    (Answered 24 hrs a day)

## 2021-09-17 NOTE — Progress Notes
Name: Sydney Mcconnell          MRN: [id number]      DOB: [birth year]      AGE: 71 y.o.   DATE OF SERVICE: 09/17/2021    Subjective:             Reason for Visit:  Breast cancer    Sydney Mcconnell is a 71 y.o. female.      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 [facility])  Staging form: Breast, AJCC 6th Edition  - Clinical: No stage assigned - Unsigned  - Pathologic: Stage IIB (T2, N1, MX) - Unsigned      History of Present Illness    CANCER HISTORY: The patient was originally diagnosed with right breast cancer in 1995, T2N1M0, 3/32 positive lymph nodes. Tumor was ER/PR positive, HER-2 Positive. She received chemotherapy with Adriamycin and Cytoxan for four cycles and then received five years of tamoxifen.  ?  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  She had an intramedullary rod placed in the right femur and had radiation. She underwent 6 cycles of Taxotere and Herceptin, and was placed on Aredia and Arimidex. The Aredia was eventually switched to Zometa.   ?  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 She underwent debridement, 20 hyperbaric oxygen therapies, and was dramatically improved. The process essentially arrested in 2008.   ?  In 2010 she had the right femur rod replaced as it was bending. Intramedullary rod replaced again in Jan 2011 when she tripped and was found to have a linear nondisplaced fracture of the lateral cortex of the proximal R diaphysis corresponding to the focus of increased enhancement on the bone scan.  ?  March 30, 2012 she had a fall and broke left femur. A rod was placed there. She had a long period of PT.   ?  She did well until May 2014 when she felt she pulled a muscle or broke a bone in her rib or scapula and had been in extreme pain not relieved by NSAIDS.     Bone scan 09/26/12 showed an area of increased uptake in left posterior rib in area of discomfort. Compatible with small rib fracture (patient carrying a box when it occurred ). Plain rib films negative for fracture or mass. Tumor markers did not rise.  ?  Due to delayed union of a left subtrochanteric femur fracture, she met back with Dr Cherene Julian in Orthopedic surgery. She had dynamization (removal) of the left trochanteric femoral nail on 01/01/13.     She had follow-up with Dr Cherene Julian May 2015. X-rays show significant healing since 12/2012 x-rays.   ?  Back pain after a snap sound in early July 2104:    CT chest done 11/02/12:  There are no pathologically enlarged mediastinal or hilar lymph nodes, though limited evaluation in the absence of IV contrast.   There is no new or enlarging pulmonary nodule.  There are no aggressive osseous lesions or displaced rib fracture. An old right posterior seventh rib fracture is seen. There is exaggeration of the normal thoracic kyphosis. Prior right mastectomy and flap reconstruction is again noted without axillary lymphadenopathy.  ?    Had an ER visit in July 2018 with an episode of right jaw numbness lasting 6 hours - CT head was ok.     ?  ?    ?  Past Medical History: Episodic tachycardia; follows with cardiology.     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   ?  Family History: Mother died with CHF age 92. Sister with negative BRCA testing, but died in 2015/02/05with breast and colon cancer  ?  Social: Worked as a Scientist, clinical (histocompatibility and immunogenetics); Retired  ?  ?  ?  ?  ?  INTERVAL HISTORY:  ?  She has a metastatic breast cancer history, but no recent bone progression.   ?  Here for a 6 month follow up.   ?    She has had CABG in April 2021 at [facility] in [place].   HGB was down to 7-8 post op.     We looked at HGB and iron panel in April 2022 - I asked her to take iron QOD. HGB was still only at 8.9 then. April 2022 B12 and folate were normal.   HGB was normal at 12.7 by February 05, 2023and stopped oral iron. Latest Reference Range & Units 10/24/20 10:14 05/08/21 10:09 09/17/21 11:00   Hemoglobin 12.0 - 15.0 GM/DL 16.1 (L) 09.6 04.5   Hematocrit 36 - 45 % 36.6 39.1 40.2   Platelet Count 150 - 400 K/UL 169 220 167   White Blood Cells 4.5 - 11.0 K/UL 4.8 5.8 6.1       She is feeling very well after a Atrial Fib ablation. I reviewed her cardiology note from 08/17/21 (Dr Alphonsa Overall in [place], [place]). Had monitor after stopping amiodarone which showed no recurrent A-fib.  However, she feels occ A fib. She remains on Coreg 12.5 mg twice a day and Cardizem 30 mg twice a day.     She asked to return to coumadin 10/24/20  It turns out she finished all her Earlyne Iba in October and then started her coumadin.  She was supposed to get INRs here.  I did hear from her or see her July 2022 05/31/2021.       Takes 2 mg 5 days a week. No coumadin on Wed and Saturday     Latest Reference Range & Units 07/10/21 11:07 08/03/21 12:25 09/17/21 11:03   INR POC 0.8 - 1.2  2.7 (H) 1.0 4.0 (H)       Our NP tried to contact her about her low INR in April was was not able to connect with Jiselle's phone.   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     Continues on Arimidex.  Tumor marker was normal 31-May-2021   Chronic lymphedema of right arm, chronic, unchanged. Using compression sleeves and more helpful. Has a port in left arm. Functioning well when she gets blood draws.   ?  No pain meds needed now. No fentanyl patches.   Back and leg much improved with PT  ?  Fatigue is better.   ?  Vision is poor at baseline. She will get some vertigo.   ?  Always sleeps sitting up - this is a chronic issue for years. Rare edema of legs.   ?  Mammogram March 2021: Normal           Review of Systems      No headaches or vision changes  No swallowing problems  No new lymph nodes enlarged  No bruising or petechiae  No breathing difficulty  The patient denies chest pain or pleurisy  No abdominal pain  No change in bowel habits or gastrointestinal bleeding  No hematuria  There has been no change in arthralgias  No new skin rashes  No leg edema noted  Objective:         ? anastrozole (ARIMIDEX) 1 mg tablet TAKE ONE TABLET BY MOUTH EVERY DAY   ? aspirin 81 mg chewable tablet Chew 81 mg by mouth daily. Take with food.   ? atorvastatin (LIPITOR) 20 mg tablet    ? Calcium Carbonate 600 mg (1,500 mg) tab Take 2 Tabs by mouth daily.   ? CARVEDILOL (COREG PO) Take  by mouth. Indications: 1/2 po bid   ? cholecalciferol (VITAMIN D-3) 400 unit tab Take 400 Units by mouth twice daily.   ? warfarin (COUMADIN) 2 mg tablet Take one tablet by mouth every 48 hours. Patient taking 2mg  PO every other day     There were no vitals filed for this visit.  There is no height or weight on file to calculate BMI.                  Pain Addressed:  N/A    Patient Evaluated for a Clinical Trial: No treatment clinical trial available for this patient.     [place] Cooperative Oncology Group performance status is 0, Fully active, able to carry on all pre-disease performance without restriction.Marland Kitchen     Physical Exam       In general, the patient is alert and pleasant  No scleral icterus  No neck lymphadenopathy or supraclavicular lymphadenopathy  No oral thrush   Lungs are clear without wheezing   Currently RRR  Extremities show no edema or tenderness  Abdomen not distended  No rash or petechiae  Normal gait; normal cranial nerves           Assessment and Plan:    1. Anticoagulated/History of PE and DVT:   ?  Was on Xarelto after her CABG   ?  She asked to return to coumadin 10/24/20    It turns out she finished all her Xarelto in October 2022 and then started her coumadin.    Cont 2 mg 5 days a week    None on Wed and Sat.     Hold coumadin tonight.     INR monthly    ?  2. Metastatic breast cancer history: The patient was originally diagnosed with right breast cancer in 1995, T2N1M0, 3/32 positive lymph nodes. Tumor was ER/PR positive, HER-2 Positive. She received chemotherapy with Adriamycin and Cytoxan for four cycles and then received five years of tamoxifen.  ?  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  She had an intramedullary rod placed in the right femur and had radiation. She underwent 6 cycles of Taxotere and Herceptin, and was placed on Aredia and Arimidex.   ?  Remains on Anastrazole (Arimidex).  She has had no clinical recurrence, and has a normal tumor marker in Nov 2020  ?  No signs of clinical recurrence.   ?  Tumor marker has been normal, and pending from today     Latest Reference Range & Units 05/08/21 10:09   CA27.29 U/mL 19.5       ?  3. Slight stable murmur; no signs of CHF    Feels occ A fib.     Had an ablation in the past. Now back on Cardizem.         4. CAD: s/p CABG.   ?  Cardiac rehab  now stopped  She is riding her exercise bike though  ?  ?  5. Back pain:  Was briefly on fentanyl patches, then stopped    PT had helped her back   (Would likely help her neck too)      6. Arm port in place: Flush at least every 3 months, I have asked her to do this more often.   Flushed today  ?      7. Anemia post op - was worse, and now improving.      With such good improvement stopped oral iron.   ?      8. Osteopenia on DEXA May 2020  ?      9. Neck ROM in C spine is off    Has curvature of C spine and hard to lift head up  Hurts some times.     Ok to use chiropractor.         10. Weight gain    Add TSH today          Labs still pending today    RTC in 6 months  ?

## 2021-09-17 NOTE — Progress Notes
Patient presents for lab draw from port. Port accessed, labs drawn, and port de-accessed by S. Suezanne Cheshire, Therapist, sports. Patient assisted to lab for INR finger prick. Patient had office visit with Celesta Aver, MD after labs.

## 2021-09-18 ENCOUNTER — Encounter: Admit: 2021-09-18 | Discharge: 2021-09-18 | Payer: PRIVATE HEALTH INSURANCE

## 2021-10-16 ENCOUNTER — Encounter: Admit: 2021-10-16 | Discharge: 2021-10-16 | Payer: PRIVATE HEALTH INSURANCE

## 2021-10-26 ENCOUNTER — Encounter: Admit: 2021-10-26 | Discharge: 2021-10-26 | Payer: PRIVATE HEALTH INSURANCE

## 2021-11-02 ENCOUNTER — Encounter: Admit: 2021-11-02 | Discharge: 2021-11-02 | Payer: PRIVATE HEALTH INSURANCE

## 2021-11-18 ENCOUNTER — Encounter: Admit: 2021-11-18 | Discharge: 2021-11-18 | Payer: PRIVATE HEALTH INSURANCE

## 2021-11-18 DIAGNOSIS — C50811 Malignant neoplasm of overlapping sites of right female breast: Secondary | ICD-10-CM

## 2021-11-18 DIAGNOSIS — Z95828 Presence of other vascular implants and grafts: Secondary | ICD-10-CM

## 2021-11-18 LAB — PROTIME INR (PT): INR POC: 4 M/UL — ABNORMAL HIGH (ref 0.8–1.2)

## 2021-11-18 NOTE — Progress Notes
Patient her for INR draw and port flush.  Patient denies complaints and any new symptoms.  Port accessed with 20 G 3/4 inch needle using sterile technique.  Unable to obtain Blood return. Infiltration present, edema and pain with flushing.  The back of the port was felt during port access.   Dr. Elna Breslow team notified of complications and will order a port evaluation study.  Patient aware that someone from scheduling should be calling.  Patient discharged in good condition.

## 2021-11-18 NOTE — Patient Instructions
Sydney Mcconnell  Discharge Instructions      Sydney Mcconnell  11/18/2021    Treatment Received Today:  Port acccess            Discharge Instructions  Call immediately to report the following:  Uncontrolled nausea or vomiting, pain, or bleeding  Temperature of 100.5 F or greater or any sign/symptom of infection (warmth, redness, tenderness)  Painful mouth or difficulty swallowing  Diarrhea   Swelling of arms or legs  Rash     Post-Treatment Directions:  Use mouth rinses after meals and at bedtime.  Use a non-alcohol commercial brand rinse   or mild salt water/baking soda rinse.    Drink 8-10 glasses of fluids daily.  Try to exercise daily to decrease fatigue.      Medication Instructions  If there are any specific medication instructions they are written below      Phone Numbers  [place] Phone # [phone]   (Answered 24 hrs a day)

## 2021-11-19 ENCOUNTER — Encounter: Admit: 2021-11-19 | Discharge: 2021-11-19 | Payer: PRIVATE HEALTH INSURANCE

## 2021-11-19 DIAGNOSIS — Z452 Encounter for adjustment and management of vascular access device: Secondary | ICD-10-CM

## 2021-11-19 DIAGNOSIS — Z95828 Presence of other vascular implants and grafts: Secondary | ICD-10-CM

## 2021-11-20 ENCOUNTER — Encounter: Admit: 2021-11-20 | Discharge: 2021-11-20 | Payer: PRIVATE HEALTH INSURANCE

## 2021-11-20 DIAGNOSIS — Z452 Encounter for adjustment and management of vascular access device: Secondary | ICD-10-CM

## 2021-11-20 DIAGNOSIS — Z95828 Presence of other vascular implants and grafts: Secondary | ICD-10-CM

## 2021-11-20 NOTE — Telephone Encounter
Nurse page received from [facility] at [facility] IR radiology requesting additional information regarding left arm port that was placed in 1999.  RN contacted pt who states the port was originally placed at [place] in 1999 and the pt is unsure if she wants the left arm port removed even if it is not working well.  Pt states she prefers to have the dye study done at [facility] versus [facility] but it may be 2 months or so before she can get in to have it done.  Pt denies swelling or redness to her left arm or fevers.  RN explained the importance of proceeding with a dye study to check the functioning purpose of the port.  Explained that if the port is not working, the [employer] radiologist will most likely recommend the arm port be removed and would place a new port if necessary in her chest.  Pt agreeable for RN to place the IR referral to [place] and she will see what dates they offer her.  Pt verbalized understanding.

## 2021-12-03 ENCOUNTER — Encounter: Admit: 2021-12-03 | Discharge: 2021-12-03 | Payer: PRIVATE HEALTH INSURANCE

## 2021-12-03 DIAGNOSIS — D6859 Other primary thrombophilia: Secondary | ICD-10-CM

## 2021-12-03 DIAGNOSIS — Z7901 Long term (current) use of anticoagulants: Secondary | ICD-10-CM

## 2021-12-03 LAB — PROTIME INR (PT): INR POC: 3.1 — ABNORMAL HIGH (ref 0.8–1.2)

## 2022-01-01 ENCOUNTER — Encounter: Admit: 2022-01-01 | Discharge: 2022-01-01 | Payer: PRIVATE HEALTH INSURANCE

## 2022-01-01 DIAGNOSIS — I351 Nonrheumatic aortic (valve) insufficiency: Secondary | ICD-10-CM

## 2022-01-01 DIAGNOSIS — E669 Obesity, unspecified: Secondary | ICD-10-CM

## 2022-01-01 DIAGNOSIS — I82409 Acute embolism and thrombosis of unspecified deep veins of unspecified lower extremity: Secondary | ICD-10-CM

## 2022-01-01 DIAGNOSIS — I10 Essential (primary) hypertension: Secondary | ICD-10-CM

## 2022-01-01 DIAGNOSIS — C50919 Malignant neoplasm of unspecified site of unspecified female breast: Secondary | ICD-10-CM

## 2022-01-01 DIAGNOSIS — T148XXA Other injury of unspecified body region, initial encounter: Secondary | ICD-10-CM

## 2022-01-04 ENCOUNTER — Encounter: Admit: 2022-01-04 | Discharge: 2022-01-04 | Payer: PRIVATE HEALTH INSURANCE

## 2022-01-04 DIAGNOSIS — D6859 Other primary thrombophilia: Secondary | ICD-10-CM

## 2022-01-04 NOTE — Progress Notes
Interventional Radiology Outpatient Scheduling Checklist  ?  ? 1.? Name of Procedure(s):?? Port Treat  ?  NOTE:  Staff physician only per pt request; Per order, port in left arm placed at [place] in 1999.   ?  ? 2.? Date of Procedure:?? 01/21/22  ?  ? 3.? Arrival Time:?? 1000  ?  ? 4.? Procedure Time:  1100?   ?  ? 5.? Correct Procedural Room Assignment:? [facility] Room 3  ?  ? 6.? Blood Thinners Triaged and instructed per protocol: Y/N/NA:  Yes.  Okay to continue taking Warfarin per protocol.   Confirmed accurate instructions sent to patient: Y/N:  Yes  ?  ? 7.? Procedure Order Verified: Y/N:  Yes  ?  ? 8.? Patient instructed to have a driver: Y/N/NA:  Yes  ?    9.? Patient instructed on NPO status: Y/N/NA:  Yes, 0300 & 0900  Confirmed accurate instructions sent to patient: Y/N:  Yes  ?  10.? Specimen needed: Y/N/NA:  NA   Verified Order placed: Y/N:  Yes  ?  11.? Allergies Verified:? Y/N:? Yes  ?  12.? Is there an Iodine Allergy: Y/N:? No  Does the Procedure Require contrast: Y/N:  Possibly   If so, was the IR- Contrast Allergy Pre-Procedure Medication protocol ordered: Y/NA:  NA  ?  13.? Does the patient have labs according to IR Pre-procedure Laboratory Parameter policy: Y/N/NA:  NA  If No, was the patient instructed to obtain labs prior to procedure: Y/N/NA:  NA   ?  14.? Will the patient need to be admitted or have a possible admission: Y/N:  No  If yes, confirmed accurate instructions sent to patient: Y/N/NA:  NA   ?  15.? Patient States Understanding:?Y/N:  Yes  ?  16.? History of OSA:? Y/N:  No  If yes, confirm request to bring CPAP sent to patient: Y/N/NA:  NA  ?  17. Does the patient have an insulin pump or continuous glucose monitor? Y/N: NA               If yes, was the patient instructed that this will need to be removed for this procedure and to bring supplies to reapply once the procedure is complete? Y/N  ?  18. Patient was sent electronic procedure instructions: Y/N:  Yes; my chart  ?  19.  Is patient scheduled with anesthesia/sedation? Y/N: YES         Taking GLP-1 agonist (Ozempic, [place], [place]) Y/N: NO    Pt rescheduled, did not give reason.

## 2022-01-04 NOTE — Patient Education
Dear Sydney Mcconnell,  ?  Thank you for choosing [facility] Interventional Radiology for your procedure. Your appointment information is listed below:  ?  Appointment Date: 01/21/22  Appointment Time: 11:00 AM  Arrival Time: 10:00 AM   Location:    ?  ?  ? Main Campus: [address] [place], [place]? [address]  Parking: P3 Parking Garage  Walk through [facility] Lobby to [employer]  Take Elevators to Second Floor  Follow Signs to Interventional Radiology  ?  ?  INTERVENTIONAL RADIOLOGY  PRE-PROCEDURE INSTRUCTIONS SEDATION  ?  You are scheduled for a procedure in Interventional Radiology with procedural sedation.? Please follow these instructions and any direction from your Primary Care/Managing Physician.? If you have questions about your procedure or need to reschedule, please call [phone].  ?  Medication Instructions:   You may take the following medications with a small sip of water:  <ALL MEDICATIONS>   ?  ?  ?  Diet Instructions:  a. (8) hours before your procedure (3:00 AM), stop your regular diet and start a clear liquid diet.  b. (6) hours before your procedure, discontinue tube feedings and chewing tobacco.  c. (2) hours before your procedure (9:00 AM) discontinue clear liquids.  You should have nothing by mouth. This includes GUM or CANDY.   ?  ?  Clear Liquid Diet  Water  Apple or White Grape Juice  Coffee or Tea without milk, cream, or dairy   Tea  White Cranberry Juice   Chicken Bouillon or Broth (no noodles)  Soda Pop  Popsicles                                      Beef Bouillon or Broth (no noodles)  ?  ?  Day of Exam Instructions:  1. Bathe or shower with an antibacterial soap prior to your appointment.  2. If you have a history of Obstructive Sleep Apnea (OSA) bring your CPAP/BIPAP.   3. Bring a list of your current medications and the dosages.  4. Wear comfortable clothing and leave valuables at home.  5. Arrive (1) hour prior to your appointment.  This time will be spent registering, interviewing, assessing, educating, and preparing you for the test.  ? You will be with [place] anywhere from 30 minutes to 6 hours after your exam depending on your procedure.  6. You may be sedated for the procedure. A responsible adult must drive you home (no Benedetto Goad, taxis or buses are allowed) and stay with you overnight. If you do not have a driver, we will be unable to perform your procedure.   7. You will not be able to return to work or drive the same day if receiving sedation.

## 2022-01-20 ENCOUNTER — Encounter: Admit: 2022-01-20 | Discharge: 2022-01-20 | Payer: PRIVATE HEALTH INSURANCE

## 2022-01-21 ENCOUNTER — Encounter: Admit: 2022-01-21 | Discharge: 2022-01-21 | Payer: PRIVATE HEALTH INSURANCE

## 2022-01-21 ENCOUNTER — Ambulatory Visit: Admit: 2022-01-21 | Discharge: 2022-01-21 | Payer: PRIVATE HEALTH INSURANCE

## 2022-01-21 DIAGNOSIS — I10 Essential (primary) hypertension: Secondary | ICD-10-CM

## 2022-01-21 DIAGNOSIS — Z452 Encounter for adjustment and management of vascular access device: Secondary | ICD-10-CM

## 2022-01-21 DIAGNOSIS — I82409 Acute embolism and thrombosis of unspecified deep veins of unspecified lower extremity: Secondary | ICD-10-CM

## 2022-01-21 DIAGNOSIS — C50919 Malignant neoplasm of unspecified site of unspecified female breast: Secondary | ICD-10-CM

## 2022-01-21 DIAGNOSIS — E669 Obesity, unspecified: Secondary | ICD-10-CM

## 2022-01-21 DIAGNOSIS — T148XXA Other injury of unspecified body region, initial encounter: Secondary | ICD-10-CM

## 2022-01-21 DIAGNOSIS — I351 Nonrheumatic aortic (valve) insufficiency: Secondary | ICD-10-CM

## 2022-01-21 DIAGNOSIS — Z95828 Presence of other vascular implants and grafts: Principal | ICD-10-CM

## 2022-01-21 MED ORDER — NALOXONE 0.4 MG/ML IJ SOLN
.08 mg | INTRAVENOUS | 0 refills | Status: DC | PRN
Start: 2022-01-21 — End: 2022-01-21

## 2022-01-21 MED ORDER — MIDAZOLAM 1 MG/ML IJ SOLN
1 mg | Freq: Once | INTRAVENOUS | 0 refills | Status: CP
Start: 2022-01-21 — End: ?
  Administered 2022-01-21: 17:00:00 1 mg via INTRAVENOUS

## 2022-01-21 MED ORDER — HEPARIN, PORCINE (PF) 100 UNIT/ML IV SYRG
0 refills | Status: CP
Start: 2022-01-21 — End: ?

## 2022-01-21 MED ORDER — FLUMAZENIL 0.1 MG/ML IV SOLN
.2 mg | INTRAVENOUS | 0 refills | Status: DC | PRN
Start: 2022-01-21 — End: 2022-01-21

## 2022-01-21 MED ORDER — FENTANYL CITRATE (PF) 50 MCG/ML IJ SOLN
0 refills | Status: CP
Start: 2022-01-21 — End: ?
  Administered 2022-01-21 (×4): 50 ug via INTRAVENOUS

## 2022-01-21 MED ORDER — MIDAZOLAM 1 MG/ML IJ SOLN
0 refills | Status: CP
Start: 2022-01-21 — End: ?
  Administered 2022-01-21 (×3): 1 mg via INTRAVENOUS

## 2022-01-21 MED ORDER — SODIUM CHLORIDE 0.9 % IV SOLP
1000 mL | Freq: Once | INTRAVENOUS | 0 refills | Status: DC
Start: 2022-01-21 — End: 2022-01-21

## 2022-01-21 MED ORDER — IOHEXOL 240 MG IODINE/ML IV SOLN
35 mL | Freq: Once | INTRAVENOUS | 0 refills | Status: CP
Start: 2022-01-21 — End: ?
  Administered 2022-01-21: 20:00:00 35 mL via INTRAVENOUS

## 2022-01-21 NOTE — H&P (View-Only)
IR Pre-Procedure History and Physical/Sedation Plan    Procedure Date: 01/21/2022     Planned Procedure(s):  Port check and treat    Procedural code status: FULL  Indication:  71 years old port now no longer drawing blood, concern for damage from last access as well  __________________________________________________________________    Chief Complaint:  As above    History of Present Illness: Sydney Mcconnell is a 71 y.o. female with a history as listed below who presents today for procedure.    Patient Active Problem List    Diagnosis Date Noted   ? Other specified anemias 07/08/2020   ? Coronary artery disease involving coronary bypass graft of native heart without angina pectoris 07/08/2020   ? Chronic atrial fibrillation (HCC) 07/08/2020   ? Osteopenia of multiple sites 07/08/2020   ? Hypercoagulable state (HCC) 09/26/2017   ? Subtrochanteric fracture of femur (HCC) 12/26/2012   ? Wound dehiscence 05/12/2012   ? Femur fracture, left (HCC) 03/31/2012   ? Hypertension 10/09/2010   ? Obesity 10/09/2010   ? Shortness of breath on exertion 10/09/2010   ? Aortic valve insufficiency 10/09/2010   ? Malignant neoplasm of overlapping sites of right female breast (HCC) 02/28/2007   ? Other screening mammogram 08/29/2006     Medical History:   Diagnosis Date   ? Aortic insufficiency    ? Aortic valve insufficiency 10/09/2010   ? Deep vein thrombosis (HCC)    ? Fracture    ? Hypertension    ? Malignant neoplasm of other specified sites of female breast 02/28/2007   ? Obesity 10/09/2010      Surgical History:   Procedure Laterality Date   ? MASTECTOMY  1995   ? FEMUR FRACTURE SURGERY      2003, 2011, 2013   ? FEMUR FRACTURE TX      right/left   ? HX APPENDECTOMY     ? LYMPH NODE BIOPSY     ? PORTACATH PLACEMENT        Social History     Tobacco Use   ? Smoking status: Never   ? Smokeless tobacco: Never   Substance Use Topics   ? Alcohol use: No      Family History   Problem Relation Age of Onset   ? Diabetes Mother    ? Cancer-Prostate Father    ? Cancer-Breast Maternal Aunt    ? Cancer-Breast Paternal Aunt    ? Cancer-Breast Maternal Aunt    ? Cancer-Breast Sister    ? Cancer-Breast Sister       Medications Prior to Admission   Medication Sig Dispense Refill Last Dose   ? anastrozole (ARIMIDEX) 1 mg tablet TAKE ONE TABLET BY MOUTH EVERY DAY 90 tablet 3    ? aspirin 81 mg chewable tablet Chew one tablet by mouth daily. Take with food.      ? atorvastatin (LIPITOR) 20 mg tablet       ? Calcium Carbonate 600 mg (1,500 mg) tab Take two tablets by mouth daily.      ? CARVEDILOL (COREG PO) Take  by mouth. Indications: 1/2 po bid      ? cholecalciferol (VITAMIN D-3) 400 unit tab Take one tablet by mouth twice daily.        ? dilTIAZem HCL (CARDIZEM) 30 mg tablet Take one tablet by mouth twice daily.      ? diphenhydrAMINE HCL (BENADRYL) 50 mg capsule Take 12.5 mg by mouth every 6 hours as needed.      ?  warfarin (COUMADIN) 2 mg tablet Take one tablet by mouth five times weekly.        No Known Allergies    Review of Systems  A comprehensive review of systems was negative.    Previous Personal Anesthetic/Sedation History:  Denies adverse events related to sedation/anesthesia.     Previous Family Anesthetic/Sedation History: Denies adverse events related to sedation/anesthesia.    Physical Exam:  Vital Signs: Last Filed In 24 Hours Vital Signs: 24 Hour Range                General appearance: Alert and no distress noted.  Neurologic: Grossly normal.  Lungs: Non labored at rest.  Heart: Regular rate and rhythm  Abdomen: Non-distended    Airway:  airway assessment performed  Mallampati II (soft palate, uvula, fauces visible)   Anesthesia Classification:  ASA III (A patient with a severe systemic disease that limits activity, but is not incapacitating)  Pre procedure anxiolysis plan: Midazolam  Intra-procedural Sedation/Medication Plan: Fentanyl, Lidocaine, and Midazolam  Personal history of sedation complications: Denies adverse event. Family history of sedation complications: Denies adverse event.   Medications for Reversal: Naloxone and Flumazenil  Discussion/Reviews:  Physician has discussed risks and alternatives of this type of sedation and above planned procedures with patient  NPO Status: Acceptable  Pregnancy Status: N/A     Lab/Radiology/Other Diagnostic Tests:  Labs:    Hematology:    Lab Results   Component Value Date    HGB 13.0 09/17/2021    HCT 40.2 09/17/2021    PLTCT 167 09/17/2021    WBC 6.1 09/17/2021    NEUT 71 09/17/2021    ANC 4.40 09/17/2021    LYMPH 15 09/13/2019    ALC 1.00 09/17/2021    MONA 10 09/17/2021    AMC 0.60 09/17/2021    EOSA 2 09/17/2021    ABC 0.00 09/17/2021    BASOPHILS 2 09/13/2019    MCV 93.7 09/17/2021    MCH 30.4 09/17/2021    MCHC 32.5 09/17/2021    MPV 7.6 09/17/2021    RDW 13.6 09/17/2021    and General Chemistry:    Lab Results   Component Value Date    NA 140 09/17/2021    K 4.0 09/17/2021    CL 106 09/17/2021    CO2 26 09/17/2021    GAP 8 09/17/2021    BUN 20 09/17/2021    CR 0.65 09/17/2021    GLU 88 09/17/2021    GLU 104 03/14/2017    CA 9.3 09/17/2021    ALBUMIN 3.9 09/17/2021    LACTIC 1.3 04/01/2012    OBSCA 1.06 04/01/2012    MG 2.2 05/16/2012    TOTBILI 0.6 09/17/2021              Wesley Blas, APRN-NP  Pager [phone]

## 2022-01-21 NOTE — Other
Immediate Post Procedure Note    Date:  01/21/2022                                       Performing Provider:  Lonni Fix, M.D.    Procedure(s):  Left arm port exchange with venoplasty  Pre/Post Procedure Diagnosis:  Port clogged, malcunction, canno flush or aspirate       Anesthesia: sedation    Findings:  Port occluded and scarred into the vein  Specimen(s) Removed:  None    Estimated Blood Loss:  None/Negligible  Complications: None  Time out performed: Consent obtained, correct patient verified, correct procedure verified, correct site verified, patient marked as necessary.      Lonni Fix, M.D.  Neurointerventional Radiology

## 2022-01-21 NOTE — Progress Notes
Sedation physician present in room. Recent vitals and patient condition reviewed between sedating physician and nurse. Reassessment completed. Determination made to proceed with planned sedation.

## 2022-01-28 ENCOUNTER — Encounter: Admit: 2022-01-28 | Discharge: 2022-01-28 | Payer: PRIVATE HEALTH INSURANCE

## 2022-01-28 DIAGNOSIS — D6859 Other primary thrombophilia: Secondary | ICD-10-CM

## 2022-01-28 DIAGNOSIS — C50811 Malignant neoplasm of overlapping sites of right female breast: Secondary | ICD-10-CM

## 2022-01-28 DIAGNOSIS — Z7901 Long term (current) use of anticoagulants: Secondary | ICD-10-CM

## 2022-02-01 ENCOUNTER — Encounter: Admit: 2022-02-01 | Discharge: 2022-02-01 | Payer: PRIVATE HEALTH INSURANCE

## 2022-02-08 ENCOUNTER — Encounter: Admit: 2022-02-08 | Discharge: 2022-02-08 | Payer: PRIVATE HEALTH INSURANCE

## 2022-02-08 DIAGNOSIS — D6859 Other primary thrombophilia: Secondary | ICD-10-CM

## 2022-03-10 ENCOUNTER — Encounter: Admit: 2022-03-10 | Discharge: 2022-03-10 | Payer: PRIVATE HEALTH INSURANCE

## 2022-03-15 ENCOUNTER — Encounter: Admit: 2022-03-15 | Discharge: 2022-03-15 | Payer: PRIVATE HEALTH INSURANCE

## 2022-03-15 DIAGNOSIS — Z7901 Long term (current) use of anticoagulants: Secondary | ICD-10-CM

## 2022-03-15 DIAGNOSIS — D6859 Other primary thrombophilia: Secondary | ICD-10-CM

## 2022-03-15 DIAGNOSIS — T80212D Local infection due to central venous catheter, subsequent encounter: Secondary | ICD-10-CM

## 2022-03-15 DIAGNOSIS — C50811 Malignant neoplasm of overlapping sites of right female breast: Secondary | ICD-10-CM

## 2022-03-15 DIAGNOSIS — I82409 Acute embolism and thrombosis of unspecified deep veins of unspecified lower extremity: Secondary | ICD-10-CM

## 2022-03-15 DIAGNOSIS — C50919 Malignant neoplasm of unspecified site of unspecified female breast: Secondary | ICD-10-CM

## 2022-03-15 DIAGNOSIS — I10 Essential (primary) hypertension: Secondary | ICD-10-CM

## 2022-03-15 DIAGNOSIS — T148XXA Other injury of unspecified body region, initial encounter: Secondary | ICD-10-CM

## 2022-03-15 DIAGNOSIS — I351 Nonrheumatic aortic (valve) insufficiency: Secondary | ICD-10-CM

## 2022-03-15 DIAGNOSIS — E669 Obesity, unspecified: Secondary | ICD-10-CM

## 2022-03-15 NOTE — Progress Notes
Name: Sydney Mcconnell          MRN: [id number]      DOB: 10/07/1950      AGE: 71 y.o.   DATE OF SERVICE: 03/15/2022    Subjective:             Reason for Visit:  Breast cancer    Sydney Mcconnell is a 71 y.o. female.      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 [facility])  Staging form: Breast, AJCC 6th Edition  - Clinical: No stage assigned - Unsigned  - Pathologic: Stage IIB (T2, N1, MX) - Unsigned      History of Present Illness        CANCER HISTORY: The patient was originally diagnosed with right breast cancer in 1995, T2N1M0, 3/32 positive lymph nodes. Tumor was ER/PR positive, HER-2 Positive. She received chemotherapy with Adriamycin and Cytoxan for four cycles and then received five years of tamoxifen.  ?  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  She had an intramedullary rod placed in the right femur and had radiation. She underwent 6 cycles of Taxotere and Herceptin, and was placed on Aredia and Arimidex. The Aredia was eventually switched to Zometa.   ?  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 She underwent debridement, 20 hyperbaric oxygen therapies, and was dramatically improved. The process essentially arrested in 2008.   ?  In 2010 she had the right femur rod replaced as it was bending. Intramedullary rod replaced again in Jan 2011 when she tripped and was found to have a linear nondisplaced fracture of the lateral cortex of the proximal R diaphysis corresponding to the focus of increased enhancement on the bone scan.  ?  March 30, 2012 she had a fall and broke left femur. A rod was placed there. She had a long period of PT.   ?  She did well until May 2014 when she felt she pulled a muscle or broke a bone in her rib or scapula and had been in extreme pain not relieved by NSAIDS.     Bone scan 09/26/12 showed an area of increased uptake in left posterior rib in area of discomfort. Compatible with small rib fracture (patient carrying a box when it occurred ). Plain rib films negative for fracture or mass. Tumor markers did not rise.  ?  Due to delayed union of a left subtrochanteric femur fracture, she met back with Sydney Mcconnell in Orthopedic surgery. She had dynamization (removal) of the left trochanteric femoral nail on 01/01/13.     She had follow-up with Sydney Mcconnell May 2015. X-rays show significant healing since 12/2012 x-rays.   ?  Back pain after a snap sound in early July 2104:    CT chest done 11/02/12:  There are no pathologically enlarged mediastinal or hilar lymph nodes, though limited evaluation in the absence of IV contrast.   There is no new or enlarging pulmonary nodule.  There are no aggressive osseous lesions or displaced rib fracture. An old right posterior seventh rib fracture is seen. There is exaggeration of the normal thoracic kyphosis. Prior right mastectomy and flap reconstruction is again noted without axillary lymphadenopathy.  ?    Had an ER visit in July 2018 with an episode of right jaw numbness lasting 6 hours - CT head was ok.     ?  ?    ?  Past Medical History: Episodic tachycardia; follows with cardiology.     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   ?  Family History: Mother died with CHF age 80. Sister with negative BRCA testing, but died in 09-Jan-2015with breast and colon cancer  ?  Social: Worked as a Scientist, clinical (histocompatibility and immunogenetics); Retired  ?  ?  ?  ?  ?  INTERVAL HISTORY:  ?  She has a metastatic breast cancer history, but no recent bone progression.   ?  Here for a 6 month follow up.   ?  She has had CABG in April 2021 at [facility] in [place].   HGB was down to 7-8 post op. We looked at HGB and iron panel in April 2022 - I asked her to take iron QOD. HGB was still only at 8.9 then. April 2022 B12 and folate were normal. HGB was normal at 12.7 by 01/09/2023and stopped oral iron. She is feeling very well after a Atrial Fib ablation. I reviewed her cardiology note from 08/17/21 (Sydney Mcconnell in [place], [place]). Had monitor after stopping amiodarone which showed no recurrent A-fib.  However, she feels occ A fib. She remains on Coreg 12.5 mg twice a day and Cardizem 30 mg twice a day.     Back on Xarelto at this point.     She had a cardioversion last week at [facility].     She had her port replaced at [facility] 01/21/22.   This was flushed at [facility] last week       Continues on Arimidex.  Tumor marker was normal 2021-05-04   Chronic lymphedema of right arm, chronic, unchanged. Using compression sleeves and more helpful. Has a port in left arm. Functioning well when she gets blood draws.   ?  No pain meds needed now. No fentanyl patches.   Back and leg much improved with PT  ?  Fatigue is better.   ?  Vision is poor at baseline. She will get some vertigo.   ?  Always sleeps sitting up - this is a chronic issue for years. Rare edema of legs.   ?  Mammogram March 2021: Normal           Review of Systems      No headaches or vision changes  No swallowing problems  No new lymph nodes enlarged  No bruising or petechiae  No breathing difficulty  The patient denies chest pain or pleurisy  No abdominal pain  No change in bowel habits or gastrointestinal bleeding  No hematuria  There has been no change in arthralgias  No new skin rashes  No leg edema noted  Objective:         ? anastrozole (ARIMIDEX) 1 mg tablet TAKE ONE TABLET BY MOUTH EVERY DAY   ? aspirin 81 mg chewable tablet Chew one tablet by mouth daily. Take with food.   ? atorvastatin (LIPITOR) 20 mg tablet    ? Calcium Carbonate 600 mg (1,500 mg) tab Take two tablets by mouth daily.   ? CARVEDILOL (COREG PO) Take  by mouth. Indications: 1/2 po bid   ? cholecalciferol (VITAMIN D-3) 400 unit tab Take one tablet by mouth twice daily.     ? dilTIAZem HCL (CARDIZEM) 30 mg tablet Take one tablet by mouth twice daily.   ? diphenhydrAMINE HCL (BENADRYL) 50 mg capsule Take 12.5 mg by mouth every 6 hours as needed.   ?  warfarin (COUMADIN) 2 mg tablet Take one tablet by mouth five times weekly.     There were no vitals filed for this visit.  There is no height or weight on file to calculate BMI.                  Pain Addressed:  N/A    Patient Evaluated for a Clinical Trial: No treatment clinical trial available for this patient.     [place] Cooperative Oncology Group performance status is 0, Fully active, able to carry on all pre-disease performance without restriction.Marland Kitchen     Physical Exam       In general, the patient is alert and pleasant  No scleral icterus  No neck lymphadenopathy or supraclavicular lymphadenopathy  No oral thrush   Lungs are clear without wheezing   Currently RRR  Extremities show no edema or tenderness  Abdomen not distended  No rash or petechiae  Normal gait; normal cranial nerves           Assessment and Plan:    1. Anticoagulated/History of PE and DVT:   ?  Was on Xarelto after her CABG   ?  Now back on Xarelto    CBC 01/30/22 was normal.     ?    2. Metastatic breast cancer history: The patient was originally diagnosed with right breast cancer in 1995, T2N1M0, 3/32 positive lymph nodes. Tumor was ER/PR positive, HER-2 Positive. She received chemotherapy with Adriamycin and Cytoxan for four cycles and then received five years of tamoxifen.  ?  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  She had an intramedullary rod placed in the right femur and had radiation. She underwent 6 cycles of Taxotere and Herceptin, and was placed on Aredia and Arimidex.   ?  Remains on Anastrazole (Arimidex).  She has had no clinical recurrence, and has a normal tumor marker in Nov 2020  ?  No signs of clinical recurrence.   ?  Tumor marker has been normal, and pending from today    09/17/21: CA 27.29 19.8    Tumor marker checked today        ?  3. Slight stable murmur; no signs of CHF    Atrial tachycardia - ablated last week.     Remaisn on Xarelto          4. CAD: s/p CABG.   ?  Cardiac rehab now stopped  She is riding her exercise bike though  ?  ?  5. Back pain:  Was briefly on fentanyl patches, then stopped    PT had helped her back   (Would likely help her neck too)      6. Arm port replaced at this point    It is hard for her to get down to the clinic    Still needing flushes every 8 weeks        7. Anemia post op - was worse, and now improving.      With such good improvement stopped oral iron.   ?        8. Osteopenia on DEXA May 2020  ?        9. Has curvature of C spine and hard to lift head up    Neck ROM in C spine is off

## 2022-04-14 ENCOUNTER — Encounter: Admit: 2022-04-14 | Discharge: 2022-04-14 | Payer: PRIVATE HEALTH INSURANCE

## 2022-04-30 ENCOUNTER — Encounter: Admit: 2022-04-30 | Discharge: 2022-04-30 | Payer: PRIVATE HEALTH INSURANCE

## 2022-05-26 ENCOUNTER — Encounter: Admit: 2022-05-26 | Discharge: 2022-05-26 | Payer: PRIVATE HEALTH INSURANCE

## 2022-05-26 DIAGNOSIS — C50811 Malignant neoplasm of overlapping sites of right female breast: Secondary | ICD-10-CM

## 2022-05-26 DIAGNOSIS — Z95828 Presence of other vascular implants and grafts: Secondary | ICD-10-CM

## 2022-05-26 DIAGNOSIS — T80212D Local infection due to central venous catheter, subsequent encounter: Secondary | ICD-10-CM

## 2022-05-26 NOTE — Progress Notes
Patient presents for port flush (no labs needed today). Patient denies questions about plan of care, denies concerning symptoms. Tolerated port access well (powerport in L arm, it is mobile and needs stabilization for access. Points midline), flushes easily with brisk blood return noted. Flushed before de-access, patient left treatment area ambulatory and in baseline condition.

## 2022-05-26 NOTE — Patient Instructions
[facility]  Discharge Instructions      Sydney Mcconnell  05/26/2022    Treatment Received Today:  Port flush            Discharge Instructions  Call immediately to report the following:  Uncontrolled nausea or vomiting, pain, or bleeding  Temperature of 100.4 F or greater or any sign/symptom of infection (warmth, redness, tenderness)  Painful mouth or difficulty swallowing  Diarrhea   Swelling of arms or legs  Rash     Post-Treatment Directions:  Use mouth rinses after meals and at bedtime.  Use a non-alcohol commercial brand rinse   or mild salt water/baking soda rinse.    Drink 8-10 glasses of fluids daily.  Try to exercise daily to decrease fatigue.      Medication Instructions  If there are any specific medication instructions they are written below

## 2022-06-24 ENCOUNTER — Encounter: Admit: 2022-06-24 | Discharge: 2022-06-24 | Payer: PRIVATE HEALTH INSURANCE

## 2022-06-24 MED ORDER — ANASTROZOLE 1 MG PO TAB
1 mg | ORAL_TABLET | Freq: Every day | ORAL | 3 refills
Start: 2022-06-24 — End: ?

## 2022-06-25 ENCOUNTER — Encounter: Admit: 2022-06-25 | Discharge: 2022-06-25 | Payer: PRIVATE HEALTH INSURANCE

## 2022-08-17 ENCOUNTER — Encounter: Admit: 2022-08-17 | Discharge: 2022-08-17 | Payer: PRIVATE HEALTH INSURANCE

## 2022-09-16 ENCOUNTER — Encounter: Admit: 2022-09-16 | Discharge: 2022-09-16 | Payer: PRIVATE HEALTH INSURANCE

## 2022-09-16 DIAGNOSIS — I82409 Acute embolism and thrombosis of unspecified deep veins of unspecified lower extremity: Secondary | ICD-10-CM

## 2022-09-16 DIAGNOSIS — C50919 Malignant neoplasm of unspecified site of unspecified female breast: Secondary | ICD-10-CM

## 2022-09-16 DIAGNOSIS — I351 Nonrheumatic aortic (valve) insufficiency: Secondary | ICD-10-CM

## 2022-09-16 DIAGNOSIS — T80212D Local infection due to central venous catheter, subsequent encounter: Secondary | ICD-10-CM

## 2022-09-16 DIAGNOSIS — C50811 Malignant neoplasm of overlapping sites of right female breast: Secondary | ICD-10-CM

## 2022-09-16 DIAGNOSIS — T148XXA Other injury of unspecified body region, initial encounter: Secondary | ICD-10-CM

## 2022-09-16 DIAGNOSIS — E669 Obesity, unspecified: Secondary | ICD-10-CM

## 2022-09-16 DIAGNOSIS — I10 Essential (primary) hypertension: Secondary | ICD-10-CM

## 2022-09-16 DIAGNOSIS — E785 Hyperlipidemia, unspecified: Secondary | ICD-10-CM

## 2022-09-16 LAB — COMPREHENSIVE METABOLIC PANEL
ALBUMIN: 3.9 g/dL — ABNORMAL LOW (ref 3.5–5.0)
ALK PHOSPHATASE: 76 U/L (ref 25–110)
ALT: 17 U/L (ref 7–56)
ANION GAP: 8 K/UL — ABNORMAL LOW (ref 3–12)
AST: 18 U/L (ref 7–40)
CO2: 25 MMOL/L (ref 21–30)
EGFR: >60 mL/min (ref >60)
SODIUM: 138 MMOL/L (ref 137–147)
TOTAL BILIRUBIN: 0.7 mg/dL (ref 0.2–1.3)
TOTAL PROTEIN: 7.1 g/dL (ref 6.0–8.0)

## 2022-09-16 LAB — LIPID PROFILE
CHOLESTEROL: 135 mg/dL (ref <200)
HDL: 73 mg/dL — ABNORMAL HIGH (ref >40)
LDL: 57 mg/dL — ABNORMAL HIGH (ref <100)
NON HDL CHOLESTEROL: 62 mg/dL (ref 8.5–10.6)
TRIGLYCERIDES: 71 mg/dL (ref <150)
VLDL: 14 mg/dL (ref 0.4–1.00)

## 2022-09-16 LAB — CBC AND DIFF
RBC COUNT: 3.9 M/UL — ABNORMAL LOW (ref 4.0–5.0)
WBC COUNT: 6 K/UL (ref 4.5–11.0)

## 2022-09-16 NOTE — Progress Notes
Name: Sydney Mcconnell          MRN: [id number]      DOB: [birth year]      AGE: 72 y.o.   DATE OF SERVICE: 09/16/2022    Subjective:             Reason for Visit:  Breast cancer    Sydney Mcconnell is a 72 y.o. female.      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 Tumor was ER/PR positive, HER-2 Positive. She received chemotherapy with Adriamycin and Cytoxan for four cycles and then received five years of tamoxifen.     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  She had an intramedullary rod placed in the right femur and had radiation. She underwent 6 cycles of Taxotere and Herceptin, and was placed on Aredia and Arimidex. The Aredia was eventually switched to Zometa.      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 She underwent debridement, 20 hyperbaric oxygen therapies, and was dramatically improved. The process essentially arrested in 2008.      In 2010 she had the right femur rod replaced as it was bending. Intramedullary rod replaced again in Jan 2011 when she tripped and was found to have a linear nondisplaced fracture of the lateral cortex of the proximal R diaphysis corresponding to the focus of increased enhancement on the bone scan.     March 30, 2012 she had a fall and broke left femur. A rod was placed there. She had a long period of PT.      She did well until May 2014 when she felt she pulled a muscle or broke a bone in her rib or scapula and had been in extreme pain not relieved by NSAIDS.     Bone scan 09/26/12 showed an area of increased uptake in left posterior rib in area of discomfort. Compatible with small rib fracture (patient carrying a box when it occurred ). Plain rib films negative for fracture or mass. Tumor markers did not rise.     Due to delayed union of a left subtrochanteric femur fracture, she met back with Dr Cherene Julian in Orthopedic surgery. She had dynamization (removal) of the left trochanteric femoral nail on 01/01/13.     She had follow-up with Dr Cherene Julian May 2015. X-rays show significant healing since 12/2012 x-rays.      Back pain after a snap sound in early July 2104:    CT chest done 11/02/12:  There are no pathologically enlarged mediastinal or hilar lymph nodes, though limited evaluation in the absence of IV contrast.   There is no new or enlarging pulmonary nodule.  There are no aggressive osseous lesions or displaced rib fracture. An old right posterior seventh rib fracture is seen. There is exaggeration of the normal thoracic kyphosis. Prior right mastectomy and flap reconstruction is again noted without axillary lymphadenopathy.       Had an ER visit in July 2018 with an episode of right jaw numbness lasting 6  hours - CT head was ok.                Past Medical History: Episodic tachycardia; follows with cardiology.     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      Family History: Mother died with CHF age 71. Sister with negative BRCA testing, but died in February 06, 2015with breast and colon cancer     Social: Worked as a Scientist, clinical (histocompatibility and immunogenetics); Retired                 INTERVAL HISTORY:     She has a metastatic breast cancer history, but no recent bone progression.      Here for a 6 month follow up.      She has had CABG in April 2021 at [facility] in [place].   HGB was down to 7-8 post op. We looked at HGB and iron panel in April 2022 - I asked her to take iron QOD. HGB was still only at 8.9 then. April 2022 B12 and folate were normal. HGB was normal at 12.7 by 2023/02/06and stopped oral iron. She is feeling very well after a Atrial Fib ablation. I reviewed her cardiology note from 08/17/21 (Dr Alphonsa Overall in [place], [place]). Had monitor after stopping amiodarone which showed no recurrent A-fib.  However, she feels occ A fib. She remains on Coreg 12.5 mg twice a day and Cardizem 30 mg twice a day.     Back on Xarelto at this point.     She had a cardioversion at [facility].  Reviewed notes from Feb 2024. She was able to maintain sinus rhythm and today her EKG showed sinus rhythm at 77 bpm. She has developed tremor on amiodarone and her dose was decreased to 100 mg daily. She still has daily tremors.     She had her port replaced at [facility] 01/21/22.           Continues on Arimidex.    Uses a walker    Kyphosis worsening.     Chronic lymphedema of right arm, chronic, unchanged. Using compression sleeves and more helpful. Has a port in left arm. Functioning well when she gets blood draws.      No pain meds needed now. No fentanyl patches.   Back and leg much improved with PT     Fatigue is better.      Vision is poor at baseline. She will get some vertigo.      Always sleeps sitting up - this is a chronic issue for years. Rare edema of legs.      Mammogram March 2021: Normal    Chronic anticoagulation, now with Xarelto    No bleeding.            Review of Systems    No headaches or vision changes  No swallowing problems  No new lymph nodes enlarged  No bruising or petechiae  No breathing difficulty  The patient denies chest pain or pleurisy  No abdominal pain  No change in bowel habits or gastrointestinal bleeding  No hematuria  There has been no change in arthralgias  No new skin rashes  No leg edema noted  Objective:          amiodarone (CORDARONE) 200 mg tablet Take one-half tablet by mouth daily.    anastrozole (ARIMIDEX) 1 mg tablet TAKE 1 TABLET BY MOUTH EVERY DAY    aspirin 81 mg chewable tablet  Chew one tablet by mouth daily. Take with food.    atorvastatin (LIPITOR) 20 mg tablet     Calcium Carbonate 600 mg (1,500 mg) tab Take two tablets by mouth daily.    CARVEDILOL (COREG PO) Take 1 tablet by mouth twice daily. Indications: 1/2 po bid    dilTIAZem HCL (CARDIZEM) 30 mg tablet Take one tablet by mouth twice daily.    diphenhydrAMINE HCL (BENADRYL) 50 mg capsule Take 12.5 mg by mouth every 6 hours as needed.    losartan (COZAAR) 25 mg tablet Take one tablet by mouth daily.    XARELTO 20 mg tablet Take one tablet by mouth daily.     Vitals:    09/16/22 1110   BP: (!) 155/92   BP Source: Arm, Left Lower   Pulse: 75   Temp: 37.1 ?C (98.7 ?F)   Resp: 18   SpO2: 97%   TempSrc: Temporal   PainSc: Zero   Weight: Comment: Patient refused     There is no height or weight on file to calculate BMI.     Pain Score: Zero       Fatigue Scale: 5    Pain Addressed:  N/A    Patient Evaluated for a Clinical Trial: No treatment clinical trial available for this patient.     [place] Cooperative Oncology Group performance status is 0, Fully active, able to carry on all pre-disease performance without restriction.Marland Kitchen     Physical Exam     In general, the patient is alert and pleasant  uses a walker  No scleral icterus  No neck lymphadenopathy or supraclavicular lymphadenopathy  No oral thrush   Lungs are clear without wheezing   Currently RRR  Extremities show no edema or tenderness  Abdomen not distended  No rash or petechiae  Normal gait; normal cranial nerves           Assessment and Plan:    1. Anticoagulated/History of PE and DVT:      Was on Xarelto after her CABG      Now back on Xarelto    CBC 01/30/22 was normal.     CBC pending today        2. Metastatic breast cancer history: The patient was originally diagnosed with right breast cancer in 1995, T2N1M0, 3/32 positive lymph nodes. Tumor was ER/PR positive, HER-2 Positive. She received chemotherapy with Adriamycin and Cytoxan for four cycles and then received five years of tamoxifen.     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  She had an intramedullary rod placed in the right femur and had radiation. She underwent 6 cycles of Taxotere and Herceptin, and was placed on Aredia and Arimidex.      Remains on Anastrazole (Arimidex).  She has had no clinical recurrence, and has a normal tumor marker in Nov 2020     No signs of clinical recurrence.      Tumor marker has been normal, and pending from today    09/17/21: CA 27.29 19.8    Tumor marker checked today           3. Slight stable murmur; no signs of CHF    Atrial tachycardia - ablated     Remains on Xarelto          4. CAD: s/p CABG.      Cardiac rehab now stopped  She is riding her exercise bike though        5. Back pain:  Was briefly on fentanyl patches, then stopped    PT had helped her back   (Would likely help her neck too)        6. Arm port replaced at this point    Still needing flushes every 8 weeks        7. Anemia post op - was worse, and now improving.      With such good improvement stopped oral iron.            8. Osteopenia on DEXA May 2020           9. 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

## 2022-09-16 NOTE — Progress Notes
Patient presents for port flush, requesting some labs so blood drawn as well without difficulty. Port flushed before de-access, patient left treatment area ambulatory with own walker and in baseline condition, on her way to see Dr. Gerarda Fraction in clinic.

## 2022-09-16 NOTE — Patient Instructions
[facility]  Discharge Instructions      Sydney Mcconnell  09/16/2022    Treatment Received Today:  Lab draw, port flush             Discharge Instructions  Call immediately to report the following:  Uncontrolled nausea or vomiting, pain, or bleeding  Temperature of 100.4 F or greater or any sign/symptom of infection (warmth, redness, tenderness)  Painful mouth or difficulty swallowing  Diarrhea   Swelling of arms or legs  Rash     Post-Treatment Directions:  Use mouth rinses after meals and at bedtime.  Use a non-alcohol commercial brand rinse   or mild salt water/baking soda rinse.    Drink 8-10 glasses of fluids daily.  Try to exercise daily to decrease fatigue.      Medication Instructions  If there are any specific medication instructions they are written below        Phone Numbers  Cancer Center Phone # [phone]   (Answered 24 hrs a day)

## 2022-12-13 ENCOUNTER — Encounter: Admit: 2022-12-13 | Discharge: 2022-12-13 | Payer: PRIVATE HEALTH INSURANCE

## 2022-12-14 ENCOUNTER — Encounter: Admit: 2022-12-14 | Discharge: 2022-12-14 | Payer: PRIVATE HEALTH INSURANCE

## 2022-12-14 DIAGNOSIS — E785 Hyperlipidemia, unspecified: Secondary | ICD-10-CM

## 2022-12-14 DIAGNOSIS — C50811 Malignant neoplasm of overlapping sites of right female breast: Secondary | ICD-10-CM

## 2022-12-14 DIAGNOSIS — T80212D Local infection due to central venous catheter, subsequent encounter: Secondary | ICD-10-CM

## 2023-02-19 ENCOUNTER — Inpatient Hospital Stay: Admit: 2023-02-19 | Discharge: 2023-02-19 | Payer: PRIVATE HEALTH INSURANCE

## 2023-02-19 ENCOUNTER — Encounter: Admit: 2023-02-19 | Discharge: 2023-02-19 | Payer: PRIVATE HEALTH INSURANCE

## 2023-02-19 NOTE — Progress Notes
HPI:    ED--> swelling to the left eyelid-approx 24h ago  Denies vision changes or any other symptoms  Denies trauma     PMH: Afib; Breast CA; CAD; HTN; Obesity    Vital Signs:    BP-196/92  T-97.9  HR-78 --> EKG (-)   RR-18  SpO2-97% on RA  MS: A/O x4    Imaging / Procedures:  CT Orbits--> consistent with cellulitis involving preseptal soft tissue; 1.6 sonometer fluid collection at the medial inferior left orbit concerning for abscess     Labs:  WBC-7.31  K+2.7   BC x1 Drawn     Meds / Drips:   KCL  Clindamycin     Reason for Transfer: Optho

## 2023-03-02 ENCOUNTER — Encounter: Admit: 2023-03-02 | Discharge: 2023-03-02 | Payer: MEDICARE

## 2023-03-10 ENCOUNTER — Encounter: Admit: 2023-03-10 | Discharge: 2023-03-10 | Payer: MEDICARE

## 2023-03-10 ENCOUNTER — Encounter: Admit: 2023-03-10 | Discharge: 2023-03-10 | Payer: 59

## 2023-03-10 ENCOUNTER — Encounter: Admit: 2023-03-10 | Discharge: 2023-03-10 | Payer: PRIVATE HEALTH INSURANCE

## 2023-03-10 DIAGNOSIS — C50811 Malignant neoplasm of overlapping sites of right female breast: Secondary | ICD-10-CM

## 2023-03-10 DIAGNOSIS — E785 Hyperlipidemia, unspecified: Secondary | ICD-10-CM

## 2023-03-10 DIAGNOSIS — T80212D Local infection due to central venous catheter, subsequent encounter: Secondary | ICD-10-CM

## 2023-03-10 LAB — COMPREHENSIVE METABOLIC PANEL
~~LOC~~ BKR ALBUMIN: 3.5 g/dL — ABNORMAL LOW (ref 3.5–5.0)
~~LOC~~ BKR ALK PHOSPHATASE: 61 U/L (ref 25–110)
~~LOC~~ BKR ALT: 13 U/L (ref 7–56)
~~LOC~~ BKR BLD UREA NITROGEN: 31 mg/dL — ABNORMAL HIGH (ref 7–25)
~~LOC~~ BKR CALCIUM: 9.2 mg/dL (ref 8.5–10.6)
~~LOC~~ BKR CREATININE: 1.4 mg/dL — ABNORMAL HIGH (ref 0.40–1.00)
~~LOC~~ BKR GLUCOSE, RANDOM: 91 mg/dL (ref 70–100)
~~LOC~~ BKR TOTAL BILIRUBIN: 0.8 mg/dL (ref 0.3–1.2)
~~LOC~~ BKR TOTAL PROTEIN: 6.9 g/dL (ref 6.0–8.0)

## 2023-03-10 LAB — CBC AND DIFF
~~LOC~~ BKR ABSOLUTE BASO COUNT: 0.1 10*3/uL (ref 0.00–0.20)
~~LOC~~ BKR ABSOLUTE EOS COUNT: 0.1 10*3/uL (ref 0.00–0.45)
~~LOC~~ BKR ABSOLUTE LYMPH COUNT: 1.1 10*3/uL (ref 1.00–4.80)
~~LOC~~ BKR ABSOLUTE MONO COUNT: 0.6 10*3/uL — ABNORMAL LOW (ref 0.00–0.80)
~~LOC~~ BKR ABSOLUTE NEUTROPHIL: 3.6 10*3/uL — ABNORMAL HIGH (ref 1.80–7.00)
~~LOC~~ BKR EOSINOPHILS %: 2 % (ref 0–5)
~~LOC~~ BKR HEMATOCRIT: 32 % — ABNORMAL LOW (ref 36.0–45.0)
~~LOC~~ BKR HEMOGLOBIN: 10 g/dL — ABNORMAL LOW (ref 12.0–15.0)
~~LOC~~ BKR MCV: 89 fL (ref 80.0–100.0)
~~LOC~~ BKR RBC COUNT: 3.6 10*6/uL — ABNORMAL LOW (ref 4.00–5.00)
~~LOC~~ BKR WBC COUNT: 5.6 10*3/uL (ref 4.5–11.0)

## 2023-03-14 ENCOUNTER — Encounter: Admit: 2023-03-14 | Discharge: 2023-03-14 | Payer: 59

## 2023-03-14 NOTE — Telephone Encounter
Lucendia Herrlich, nurse at the [facility], called stating they have lab results to report on labs we ordered.   Attempted to call Lucendia Herrlich back and left VM for Lucendia Herrlich explaining pt just had labs with [place] 11/14 and we had no further orders but requested results be faxed to [place]. Provided contact information to further discuss results, questions, or concerns.   Attempted to contact Lucendia Herrlich again but left another VM.     I attempted to contact pt to further discuss having any more recent labs. I also wanted to discuss K+ of 2.8 and Creat of 1.41 from 11/14. I requested pt call back to review and discuss lab results.

## 2023-03-15 ENCOUNTER — Encounter: Admit: 2023-03-15 | Discharge: 2023-03-15 | Payer: MEDICARE

## 2023-03-28 ENCOUNTER — Encounter: Admit: 2023-03-28 | Discharge: 2023-03-28 | Payer: MEDICARE

## 2023-04-07 ENCOUNTER — Encounter: Admit: 2023-04-07 | Discharge: 2023-04-07 | Payer: MEDICARE

## 2023-04-07 ENCOUNTER — Encounter: Admit: 2023-04-07 | Discharge: 2023-04-07 | Payer: PRIVATE HEALTH INSURANCE

## 2023-04-07 DIAGNOSIS — T80212D Local infection due to central venous catheter, subsequent encounter: Secondary | ICD-10-CM

## 2023-04-07 DIAGNOSIS — E785 Hyperlipidemia, unspecified: Secondary | ICD-10-CM

## 2023-04-07 DIAGNOSIS — C50811 Malignant neoplasm of overlapping sites of right female breast: Secondary | ICD-10-CM

## 2023-04-07 NOTE — Progress Notes
Name: Sydney Mcconnell          MRN: [id number]      DOB: 1950/05/27      AGE: 72 y.o.   DATE OF SERVICE: 04/07/2023    Subjective:             Reason for Visit:  Breast cancer    Sydney Mcconnell is a 72 y.o. female.      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 [facility])  Staging form: Breast, AJCC 6th Edition  - Clinical: No stage assigned - Unsigned  - Pathologic: Stage IIB (T2, N1, MX) - Unsigned      History of Present Illness    Telehealth follow u    Total Time Today was 25 minutes in the following activities: Preparing to see the patient, Obtaining and/or reviewing separately obtained history, Performing a medically appropriate examination and/or evaluation, Counseling and educating the patient/family/caregiver, Ordering medications, tests, or procedures, and Referring and communication with other health care professionals (when not separately reported)         CANCER HISTORY: The patient was originally diagnosed with right breast cancer in 1995, T2N1M0, 3/32 positive lymph nodes. Tumor was ER/PR positive, HER-2 Positive. She received chemotherapy with Adriamycin and Cytoxan for four cycles and then received five years of tamoxifen.     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  She had an intramedullary rod placed in the right femur and had radiation. She underwent 6 cycles of Taxotere and Herceptin, and was placed on Aredia and Arimidex. The Aredia was eventually switched to Zometa.      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 She underwent debridement, 20 hyperbaric oxygen therapies, and was dramatically improved. The process essentially arrested in 2008.      In 2010 she had the right femur rod replaced as it was bending. Intramedullary rod replaced again in Jan 2011 when she tripped and was found to have a linear nondisplaced fracture of the lateral cortex of the proximal R diaphysis corresponding to the focus of increased enhancement on the bone scan.     March 30, 2012 she had a fall and broke left femur. A rod was placed there. She had a long period of PT.      She did well until May 2014 when she felt she pulled a muscle or broke a bone in her rib or scapula and had been in extreme pain not relieved by NSAIDS.     Bone scan 09/26/12 showed an area of increased uptake in left posterior rib in area of discomfort. Compatible with small rib fracture (patient carrying a box when it occurred ). Plain rib films negative for fracture or mass. Tumor markers did not rise.     Due to delayed union of a left subtrochanteric femur fracture, she met back with Dr Cherene Julian in Orthopedic surgery. She had dynamization (removal) of the left trochanteric femoral nail on 01/01/13.     She had follow-up with Dr Cherene Julian May 2015. X-rays show significant healing since 12/2012 x-rays.      Back pain after a snap sound in early July 2104:    CT chest done 11/02/12:  There are no pathologically enlarged mediastinal or hilar lymph nodes, though limited evaluation in the absence of IV contrast.   There is no new or enlarging pulmonary  nodule.  There are no aggressive osseous lesions or displaced rib fracture. An old right posterior seventh rib fracture is seen. There is exaggeration of the normal thoracic kyphosis. Prior right mastectomy and flap reconstruction is again noted without axillary lymphadenopathy.       Had an ER visit in July 2018 with an episode of right jaw numbness lasting 6 hours - CT head was ok.                Past Medical History: Episodic tachycardia; follows with cardiology.     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      Family History: Mother died with CHF age 54. Sister with negative BRCA testing, but died in 02-15-2015with breast and colon cancer     Social: Worked as a Scientist, clinical (histocompatibility and immunogenetics); Retired INTERVAL HISTORY:     She has a metastatic breast cancer history, but no recent bone progression.      6 month follow up.     She had labs 03/24/23 for [place] and her potassium was quite low. We had her visit the ED. Patient stated to ER doctor that she has been increasingly weak since she was discharged after a left orbital infection. Patient notes that she has had ongoing episodes of diarrhea. She was given IV Potassium and oral K.     K was 3.1    Recent bad eye infection. Had IV ABX and hospitalized at [facility].       She has had CABG in April 2021 at [facility] in [place]. She remains on Coreg 12.5 mg twice a day and Cardizem 30 mg twice a day. She had a cardioversion at [facility].  Reviewed notes from Feb 2024. She was able to maintain sinus rhythm and today her EKG showed sinus rhythm at 77 bpm. She has developed tremor on amiodarone and her dose was decreased to 100 mg daily. She still has daily tremors.     She had her port replaced at [facility] 01/21/22.       Oral iron stopped in May 2024. She is anemic again.      Latest Reference Range & Units 09/16/22 11:54 03/10/23 10:46   Hemoglobin 12.0 - 15.0 g/dL 82.9 56.2 (L)   Hematocrit 36.0 - 45.0 % 36.7 32.8 (L)   Platelet Count 150 - 400 10*3/uL 197 202   White Blood Cells 4.5 - 11.0 10*3/uL 6.0 5.6   Neutrophils 41 - 77 % 72 65       Continues on Arimidex.    Uses a walker  Kyphosis worsening.     Chronic lymphedema of right arm, chronic, unchanged. Using compression sleeves and more helpful. Has a port in left arm. Functioning well when she gets blood draws.      No pain meds needed now. No fentanyl patches.   Back and leg much improved with PT     Vision is poor at baseline. She will get some vertigo.      Always sleeps sitting up - this is a chronic issue for years. Rare edema of legs.      Mammogram March 2021: Normal      Other labs reviewed:     Latest Reference Range & Units 09/16/22 11:54 03/10/23 10:46   Sodium 137 - 147 mmol/L 138 142   Potassium 3.5 - 5.1 mmol/L 4.3 2.8 (L)   Chloride 98 - 110 mmol/L 105 99   CO2 21 - 30 mmol/L  25 32 (H)   Anion Gap 3 - 12  8 10    Blood Urea Nitrogen 7 - 25 mg/dL 28 (H) 31 (H)   Creatinine 0.40 - 1.00 mg/dL 5.95 6.38 (H)   eGFR >75 mL/min >60    Glomerular Filtration Rate (GFR) >60 mL/min  40 (L)   Glucose 70 - 100 mg/dL 643 (H) 91   Albumin 3.5 - 5.0 g/dL 3.9 3.5   Calcium 8.5 - 10.6 mg/dL 9.3 9.2   Total Bilirubin 0.3 - 1.2 mg/dL 0.7 0.8   Total Protein 6.0 - 8.0 g/dL 7.1 6.9   AST (SGOT) 7 - 40 U/L 18 18   ALT (SGPT) 7 - 56 U/L 17 13   Alk Phosphatase 25 - 110 U/L 76 61          Review of Systems    No headaches or vision changes  No swallowing problems  No new lymph nodes enlarged  No bruising or petechiae  No breathing difficulty  The patient denies chest pain or pleurisy  No abdominal pain  No change in bowel habits or gastrointestinal bleeding  No hematuria  There has been no change in arthralgias  No new skin rashes  No leg edema noted      Objective:          amiodarone (CORDARONE) 200 mg tablet Take one-half tablet by mouth daily.    anastrozole (ARIMIDEX) 1 mg tablet TAKE ONE TABLET BY MOUTH EVERY DAY    aspirin 81 mg chewable tablet Chew one tablet by mouth daily. Take with food.    atorvastatin (LIPITOR) 20 mg tablet     Calcium Carbonate 600 mg (1,500 mg) tab Take two tablets by mouth daily.    CARVEDILOL (COREG PO) Take 1 tablet by mouth twice daily. Indications: 1/2 po bid    dilTIAZem HCL (CARDIZEM) 30 mg tablet Take one tablet by mouth twice daily.    diphenhydrAMINE HCL (BENADRYL) 50 mg capsule Take 12.5 mg by mouth every 6 hours as needed.    XARELTO 20 mg tablet Take one tablet by mouth daily.     There were no vitals filed for this visit.    There is no height or weight on file to calculate BMI.                  Pain Addressed:  N/A    Patient Evaluated for a Clinical Trial: No treatment clinical trial available for this patient.     [place] Cooperative Oncology Group performance status is 0, Fully active, able to carry on all pre-disease performance without restriction.Marland Kitchen     Physical Exam     In general, the patient is alert and pleasant  uses a walker           Assessment and Plan:    1. Anticoagulated/History of PE and DVT:      Was on Xarelto after her CABG      Remains on Xarelto    No clinical bleeding.         2. Metastatic breast cancer history: The patient was originally diagnosed with right breast cancer in 1995, T2N1M0, 3/32 positive lymph nodes. Tumor was ER/PR positive, HER-2 Positive. She received chemotherapy with Adriamycin and Cytoxan for four cycles and then received five years of tamoxifen.     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  She had an intramedullary rod placed in the right femur and had radiation. She underwent 6  cycles of Taxotere and Herceptin, and was placed on Aredia and Arimidex.      Remains on Anastrazole (Arimidex).  She has had no clinical recurrence, and has a normal tumor marker in Nov 2020     No signs of clinical recurrence.       Latest Reference Range & Units 09/16/22 11:54   CA27.29 U/mL 18.0              3. Slight stable murmur; no signs of CHF    Atrial tachycardia - ablated     Remains on Xarelto          4. CAD: s/p CABG.      Cardiac rehab now stopped         5. Back pain:  Was briefly on fentanyl patches, then stopped    PT had helped her back   (Would likely help her neck too)        6. Anemia, iron deficiency    In May 2024, had stopped oral iron.      HGB now reduced    Discussed starting oral iron again  She agrees to start iron again    Had colonoscopy end of July 204 - no bleeding        7. Creatinine up to 1.4 and potassium lower    K replaced orally        8. Osteopenia on DEXA May 2020           9. 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

## 2023-04-19 ENCOUNTER — Encounter: Admit: 2023-04-19 | Discharge: 2023-04-19 | Payer: MEDICARE

## 2023-06-21 ENCOUNTER — Encounter: Admit: 2023-06-21 | Discharge: 2023-06-21 | Payer: MEDICARE

## 2023-06-21 DIAGNOSIS — T80212D Local infection due to central venous catheter, subsequent encounter: Secondary | ICD-10-CM

## 2023-06-21 NOTE — Progress Notes
 Patient here today for routine port flush. Port to left arm accessed, positive blood return noted. Port flushed with saline and de accessed. To return as scheduled.

## 2023-07-27 ENCOUNTER — Encounter: Admit: 2023-07-27 | Discharge: 2023-07-27

## 2023-10-04 ENCOUNTER — Encounter: Admit: 2023-10-04 | Discharge: 2023-10-04 | Payer: MEDICARE

## 2023-10-04 DIAGNOSIS — T80212D Local infection due to central venous catheter, subsequent encounter: Secondary | ICD-10-CM

## 2023-10-04 DIAGNOSIS — C50811 Malignant neoplasm of overlapping sites of right female breast: Secondary | ICD-10-CM

## 2023-10-04 LAB — CBC AND DIFF
~~LOC~~ BKR ABSOLUTE BASO COUNT: 0.1 10*3/uL (ref 0.00–0.20)
~~LOC~~ BKR ABSOLUTE EOS COUNT: 0 10*3/uL (ref 0.00–0.45)
~~LOC~~ BKR ABSOLUTE LYMPH COUNT: 0.9 10*3/uL — ABNORMAL LOW (ref 1.00–4.80)
~~LOC~~ BKR ABSOLUTE MONO COUNT: 0.5 10*3/uL (ref 0.00–0.80)
~~LOC~~ BKR ABSOLUTE NEUTROPHIL: 3.3 10*3/uL (ref 1.80–7.00)
~~LOC~~ BKR BASOPHILS %: 1.2 % (ref 0.0–2.0)
~~LOC~~ BKR HEMATOCRIT: 31 % — ABNORMAL LOW (ref 36.0–45.0)
~~LOC~~ BKR HEMOGLOBIN: 10 g/dL — ABNORMAL LOW (ref 12.0–15.0)
~~LOC~~ BKR MCH: 28 pg (ref 26.0–34.0)
~~LOC~~ BKR MCHC: 33 g/dL — ABNORMAL HIGH (ref 32.0–36.0)
~~LOC~~ BKR MCV: 86 fL — ABNORMAL LOW (ref 80.0–100.0)
~~LOC~~ BKR PLATELET COUNT: 191 10*3/uL (ref 150–400)
~~LOC~~ BKR RBC COUNT: 3.6 10*6/uL — ABNORMAL LOW (ref 4.00–5.00)
~~LOC~~ BKR RDW: 17 % — ABNORMAL HIGH (ref 11.0–15.0)
~~LOC~~ BKR WBC COUNT: 4.7 10*3/uL (ref 4.50–11.00)

## 2023-10-04 LAB — COMPREHENSIVE METABOLIC PANEL
~~LOC~~ BKR ALT: 15 U/L (ref 7–56)
~~LOC~~ BKR ANION GAP: 6 % — ABNORMAL HIGH (ref 3–12)
~~LOC~~ BKR AST: 17 U/L (ref 7–40)
~~LOC~~ BKR CO2: 29 mmol/L — ABNORMAL LOW (ref 21–30)
~~LOC~~ BKR GLOMERULAR FILTRATION RATE (GFR): >60 mL/min (ref >60–5.0)
~~LOC~~ BKR POTASSIUM: 3.9 mmol/L (ref 3.5–5.1)
~~LOC~~ BKR SODIUM, SERUM: 139 mmol/L (ref 137–147)

## 2024-01-16 ENCOUNTER — Encounter: Admit: 2024-01-16 | Discharge: 2024-01-16 | Payer: MEDICARE

## 2024-01-17 ENCOUNTER — Encounter: Admit: 2024-01-17 | Discharge: 2024-01-17 | Payer: MEDICARE

## 2024-01-17 DIAGNOSIS — C50811 Malignant neoplasm of overlapping sites of right female breast: Principal | ICD-10-CM

## 2024-01-17 DIAGNOSIS — T80212D Local infection due to central venous catheter, subsequent encounter: Secondary | ICD-10-CM

## 2024-01-17 NOTE — Progress Notes
 Patient here for [facility] completed without complication and then patient is discharged with belongings, RN returned patient to lobby to meet family, per wheelchair.

## 2024-01-24 ENCOUNTER — Encounter: Admit: 2024-01-24 | Discharge: 2024-01-24 | Payer: MEDICARE

## 2024-03-06 ENCOUNTER — Encounter: Admit: 2024-03-06 | Discharge: 2024-03-06 | Payer: MEDICARE

## 2024-03-06 DIAGNOSIS — C50811 Malignant neoplasm of overlapping sites of right female breast: Principal | ICD-10-CM

## 2024-03-12 ENCOUNTER — Encounter: Admit: 2024-03-12 | Discharge: 2024-03-12 | Payer: MEDICARE

## 2024-03-12 MED ORDER — ANASTROZOLE 1 MG PO TAB
1 mg | ORAL_TABLET | Freq: Every day | ORAL | 0 refills | 33.00000 days | Status: AC
Start: 2024-03-12 — End: ?

## 2024-04-02 ENCOUNTER — Encounter: Admit: 2024-04-02 | Discharge: 2024-04-02 | Payer: MEDICARE

## 2024-04-02 NOTE — Telephone Encounter [36]
 Pt left message reporting she has been summoned for jury duty however does not feel that she can participate due to not being able to drive to the courthouse and also die to dizziness and vertigo it would be hard to concentrate.      Spoke to pt who reported I have it all taken care of, I called my cardiologist and they took care of it, but thank you for calling me back.  Encouraged pt to call for any other problems or questions. Pt verbalized understanding.

## 2024-04-26 ENCOUNTER — Encounter: Admit: 2024-04-26 | Discharge: 2024-04-26 | Payer: MEDICARE

## 2024-05-01 ENCOUNTER — Encounter: Admit: 2024-05-01 | Discharge: 2024-05-01 | Payer: MEDICARE

## 2024-05-01 DIAGNOSIS — C50811 Malignant neoplasm of overlapping sites of right female breast: Principal | ICD-10-CM

## 2024-05-03 ENCOUNTER — Encounter: Admit: 2024-05-03 | Discharge: 2024-05-03 | Payer: MEDICARE

## 2024-05-03 VITALS — BP 104/80 | HR 72 | Temp 96.90000°F | Resp 16

## 2024-05-03 DIAGNOSIS — Z95828 Presence of other vascular implants and grafts: Secondary | ICD-10-CM

## 2024-05-03 DIAGNOSIS — T80212D Local infection due to central venous catheter, subsequent encounter: Secondary | ICD-10-CM

## 2024-05-03 DIAGNOSIS — Z7901 Long term (current) use of anticoagulants: Secondary | ICD-10-CM

## 2024-05-03 DIAGNOSIS — C50811 Malignant neoplasm of overlapping sites of right female breast: Principal | ICD-10-CM

## 2024-05-03 DIAGNOSIS — E785 Hyperlipidemia, unspecified: Secondary | ICD-10-CM

## 2024-05-03 LAB — CBC AND DIFF
~~LOC~~ BKR ABSOLUTE BASO COUNT: 0.1 10*3/uL (ref 0.00–0.20)
~~LOC~~ BKR MCHC: 33 g/dL — ABNORMAL HIGH (ref 32.0–36.0)

## 2024-05-03 NOTE — Progress Notes [1]
 Name: Sydney Mcconnell          MRN: [id number]      DOB: 05-10-1950      AGE: 74 y.o.   DATE OF SERVICE: 05/03/2024    Subjective:             Reason for Visit:  Breast cancer    Sydney Mcconnell is a 74 y.o. female.      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 Tumor was ER/PR positive, HER-2 Positive. She received chemotherapy with Adriamycin and Cytoxan for four cycles and then received five years of tamoxifen.     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  She had an intramedullary rod placed in the right femur and had radiation. She underwent 6 cycles of Taxotere and Herceptin, and was placed on Aredia and Arimidex . The Aredia was eventually switched to Zometa.      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 She underwent debridement, 20 hyperbaric oxygen therapies, and was dramatically improved. The process essentially arrested in 2008.      In 2010 she had the right femur rod replaced as it was bending. Intramedullary rod replaced again in Jan 2011 when she tripped and was found to have a linear nondisplaced fracture of the lateral cortex of the proximal R diaphysis corresponding to the focus of increased enhancement on the bone scan.     March 30, 2012 she had a fall and broke left femur. A rod was placed there. She had a long period of PT.      She did well until May 2014 when she felt she pulled a muscle or broke a bone in her rib or scapula and had been in extreme pain not relieved by NSAIDS.     Bone scan 09/26/12 showed an area of increased uptake in left posterior rib in area of discomfort. Compatible with small rib fracture (patient carrying a box when it occurred ). Plain rib films negative for fracture or mass. Tumor markers did not rise.     Due to delayed union of a left subtrochanteric femur fracture, she met back with Dr Leola in Orthopedic surgery. She had dynamization (removal) of the left trochanteric femoral nail on 01/01/13.     She had follow-up with Dr Leola May 2015. X-rays show significant healing since 12/2012 x-rays.      Back pain after a snap sound in early July 2104:    CT chest done 11/02/12:  There are no pathologically enlarged mediastinal or hilar lymph nodes, though limited evaluation in the absence of IV contrast.   There is no new or enlarging pulmonary nodule.  There are no aggressive osseous lesions or displaced rib fracture. An old right posterior seventh rib fracture is seen. There is exaggeration of the normal thoracic kyphosis. Prior right mastectomy and flap reconstruction is again noted without axillary lymphadenopathy.       Had an ER visit in July 2018 with an episode of right jaw numbness lasting 6 hours -  CT head was ok.     She had labs 03/24/23 for us  and her potassium was quite low. We had her visit the ED. Patient stated to ER doctor that she has been increasingly weak since she was discharged after a left orbital infection. Patient notes that she has had ongoing episodes of diarrhea.                 Past Medical History: Episodic tachycardia; follows with cardiology.     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      Family History: Mother died with CHF age 66. Sister with negative BRCA testing, but died in 01/19/15with breast and colon cancer     Social: Worked as a Scientist, Clinical (histocompatibility And Immunogenetics); Retired                 INTERVAL HISTORY:     She has a metastatic breast cancer history, but no recent bone progression.     She had her port replaced at [facility] 01/21/22.     Continues on Arimidex .    She is on Xarelto, but this is expensive.     Uses a walker. Kyphosis worsening.     Chronic lymphedema of right arm, chronic, unchanged. Using compression sleeves and more helpful.      No pain meds needed now. No fentanyl  patches.   Back and leg much improved with PT     Vision is poor at baseline. She will get some vertigo.      Always sleeps sitting up - this is a chronic issue for years. Rare edema of legs.     She has had CABG in April 2021 at [facility] in [place]. Reviewed Cardiology notes from 04/13/24. She is on antiarrhythmic therapy with amiodarone and carvedilol. She is anticoagulated with Xarelto.     She had a recent PFT that shows severely decreased diffusion which has worsened from her PFT last year. She is already on low-dose amiodarone at 100 mg daily.     Her losartan was discontinued. She reports her dizziness is resolved.     She denies any chest pain or pressure, orthopnea or PND, palpitations, syncope or near syncope.        Latest Reference Range & Units 10/04/23 11:23 05/03/24 11:55   White Blood Cells 4.50 - 11.00 10*3/uL 4.70 4.80   Hemoglobin 12.0 - 15.0 g/dL 89.4 (L) 88.7 (L)   Hematocrit 36.0 - 45.0 % 31.5 (L) 33.7 (L)   Platelet Count 150 - 400 10*3/uL 191 173   Neutrophils 41.0 - 77.0 % 69.1 70.6   Absolute Neutrophil Count 1.80 - 7.00 10*3/uL 3.30 3.40          Review of Systems    No headaches or vision changes  No swallowing problems  No new lymph nodes enlarged  No bruising or petechiae  No breathing difficulty  The patient denies chest pain or pleurisy  No abdominal pain  No change in bowel habits or gastrointestinal bleeding  No hematuria  There has been no change in arthralgias  No new skin rashes  No leg edema noted      Objective:          amiodarone (PACERONE) 100 mg tablet Take one-half tablet by mouth daily.    anastrozole  (ARIMIDEX ) 1 mg tablet TAKE ONE TABLET BY MOUTH EVERY DAY    aspirin 81 mg chewable tablet Chew one tablet by mouth daily. Take with food.  atorvastatin (LIPITOR) 20 mg tablet     Calcium Carbonate 600 mg (1,500 mg) tab Take two tablets by mouth daily.    CARVEDILOL (COREG PO) Take 1 tablet by mouth twice daily. Indications: 1/2 po bid    dilTIAZem HCL (CARDIZEM) 30 mg tablet Take one tablet by mouth twice daily.    diphenhydrAMINE HCL (BENADRYL) 50 mg capsule Take 12.5 mg by mouth every 6 hours as needed.    XARELTO 20 mg tablet Take one tablet by mouth daily.     Vitals:    05/03/24 1233   BP: 104/80   BP Source: Arm, Right Upper   Pulse: 72   Temp: 36.1 ?C (96.9 ?F)   Resp: 16   SpO2: 99%   TempSrc: Temporal   PainSc: Zero   Weight: Comment: Pt declined       There is no height or weight on file to calculate BMI.     Pain Score: Zero       Fatigue Scale: 0-None    Pain Addressed:  N/A    Patient Evaluated for a Clinical Trial: No treatment clinical trial available for this patient.     Eastern Cooperative Oncology Group performance status is 0, Fully active, able to carry on all pre-disease performance without restriction.SABRA     Physical Exam     In general, the patient is alert and pleasant  No scleral icterus; no injection of the sclera  No neck lymphadenopathy or supraclavicular lymphadenopathy  No thyroid masses  No oral thrush or buccal petechiae  Lungs are clear without wheezing or dullness  Heart is regular, with no murmur  Abdomen is soft, non-tender, with normal bowel sounds, and no pain or mass  Extremities show no edema or tenderness  No rash or petechiae  Normal gait; normal cranial nerves           Assessment and Plan:    1. Anticoagulated/History of PE and DVT, plus paroxysmal A Fib:      Remains on Xarelto    She may need to change to Coumadin  due to cost    No clinical bleeding.         2. Metastatic breast cancer history: The patient was originally diagnosed with right breast cancer in 1995, T2N1M0, 3/32 positive lymph nodes. Tumor was ER/PR positive, HER-2 Positive. She received chemotherapy with Adriamycin and Cytoxan for four cycles and then received five years of tamoxifen.     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  She had an intramedullary rod placed in the right femur and had radiation. She underwent 6 cycles of Taxotere and Herceptin, and was placed on Aredia and Arimidex .      Remains on Anastrazole (Arimidex ).  She has had no clinical recurrence  No signs of clinical recurrence.             3. Atrial fibrillation and flutter [facility]):    She denies any palpitations or other symptoms.  She is status post ablation x 2.  She is on antiarrhythmic therapy with amiodarone and carvedilol.  She is anticoagulated with Xarelto.  She had a recent PFT that shows her diffusion capacity has worsened to severe.  Amiodarone to 50 mg daily per Cardiology.     Remains on Xarelto    Anticoagulation per Cardiology        4. CAD: s/p CABG.      She has history of nonobstructive coronary disease as seen on a cardiac catheterization  in 2021. Continue secondary prevention medications.          5. Back pain:  Was briefly on fentanyl  patches, then stopped    PT had helped her back   (Would likely help her neck too)    NO new pain  Really good results with PT        6. Mild Anemia, iron deficiency    In May 2024, had stopped oral iron.     She agreed last year to start iron again    Had colonoscopy end of July 204 - no bleeding    Better HGB today          7. Osteopenia on DEXA May 2020         8. 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

## 2024-05-04 ENCOUNTER — Encounter: Admit: 2024-05-04 | Discharge: 2024-05-04 | Payer: MEDICARE

## 2024-05-04 DIAGNOSIS — C50811 Malignant neoplasm of overlapping sites of right female breast: Secondary | ICD-10-CM

## 2024-05-04 DIAGNOSIS — Z452 Encounter for adjustment and management of vascular access device: Secondary | ICD-10-CM

## 2024-05-04 DIAGNOSIS — Z95828 Presence of other vascular implants and grafts: Principal | ICD-10-CM

## 2024-05-04 LAB — COMPREHENSIVE METABOLIC PANEL
~~LOC~~ BKR ALBUMIN: 3.8 g/dL (ref 3.5–5.0)
~~LOC~~ BKR ALK PHOSPHATASE: 54 U/L (ref 25–110)
~~LOC~~ BKR ALT: 10 U/L (ref 7–56)
~~LOC~~ BKR ANION GAP: 11 10*3/uL (ref 3–12)
~~LOC~~ BKR AST: 14 U/L (ref 7–40)
~~LOC~~ BKR BLD UREA NITROGEN: 28 mg/dL — ABNORMAL HIGH (ref 7–25)
~~LOC~~ BKR CALCIUM: 9.1 mg/dL (ref 8.5–10.6)
~~LOC~~ BKR CO2: 23 mmol/L (ref 21–30)
~~LOC~~ BKR CREATININE: 0.8 mg/dL (ref 0.40–1.00)
~~LOC~~ BKR GLOMERULAR FILTRATION RATE (GFR): >60 mL/min (ref >60–0.45)
~~LOC~~ BKR TOTAL BILIRUBIN: 0.5 mg/dL — ABNORMAL LOW (ref 0.2–1.3)
~~LOC~~ BKR TOTAL PROTEIN: 6.9 g/dL (ref 6.0–8.0)

## 2024-05-04 NOTE — Telephone Encounter [36]
 Dr. Allena requested assistance with setting up port flushes in [place] as this is much closer to her home.   I spoke with at [facility] primary care to see if we can coordinate this and they transferred me to the [facility]. I left detailed VM explaining request for port flushes there and requested a call back to further discuss.

## 2024-05-04 NOTE — Telephone Encounter [36]
 LaJoyce with [facility] returned call stating all they need is an order with diagnosis code and provider signature and need to know when last PAC flush was so they can schedule accordingly. They will get auth from insurance and contact pt to coordinate.  Order placed and will be faxed to [phone].

## 2024-05-09 ENCOUNTER — Encounter: Admit: 2024-05-09 | Discharge: 2024-05-09 | Payer: MEDICARE
